# Patient Record
Sex: Female | Born: 1965 | Race: White | Hispanic: No | Marital: Married | Smoking: Never smoker
Health system: Southern US, Community
[De-identification: ages and names within clinical notes are randomized; demographics above are authoritative.]

## PROBLEM LIST (undated history)

## (undated) DIAGNOSIS — N200 Calculus of kidney: Secondary | ICD-10-CM

## (undated) DIAGNOSIS — K649 Unspecified hemorrhoids: Secondary | ICD-10-CM

## (undated) DIAGNOSIS — L309 Dermatitis, unspecified: Secondary | ICD-10-CM

## (undated) DIAGNOSIS — R42 Dizziness and giddiness: Secondary | ICD-10-CM

## (undated) DIAGNOSIS — G43909 Migraine, unspecified, not intractable, without status migrainosus: Secondary | ICD-10-CM

## (undated) HISTORY — PX: HIATAL HERNIA REPAIR: SHX195

---

## 1998-06-29 HISTORY — PX: AUGMENTATION MAMMAPLASTY: SUR837

## 2018-05-27 ENCOUNTER — Emergency Department
Admission: EM | Admit: 2018-05-27 | Discharge: 2018-05-27 | Disposition: A | Discharge status: Home / Self Care | Payer: 59 | Attending: Emergency Medicine | Admitting: Emergency Medicine

## 2018-05-27 ENCOUNTER — Emergency Department: Payer: 59

## 2018-05-27 ENCOUNTER — Other Ambulatory Visit: Payer: Self-pay

## 2018-05-27 DIAGNOSIS — R1084 Generalized abdominal pain: Secondary | ICD-10-CM | POA: Diagnosis present

## 2018-05-27 DIAGNOSIS — K529 Noninfective gastroenteritis and colitis, unspecified: Secondary | ICD-10-CM | POA: Insufficient documentation

## 2018-05-27 HISTORY — DX: Calculus of kidney: N20.0

## 2018-05-27 LAB — URINALYSIS, COMPLETE (UACMP) WITH MICROSCOPIC
Bilirubin Urine: NEGATIVE
Glucose, UA: NEGATIVE mg/dL
Hgb urine dipstick: NEGATIVE
Ketones, ur: NEGATIVE mg/dL
Leukocytes, UA: NEGATIVE
Nitrite: NEGATIVE
Protein, ur: NEGATIVE mg/dL
Specific Gravity, Urine: 1.018 (ref 1.005–1.030)
pH: 7 (ref 5.0–8.0)

## 2018-05-27 LAB — CBC
HEMATOCRIT: 46.7 % — AB (ref 36.0–46.0)
Hemoglobin: 15 g/dL (ref 12.0–15.0)
MCH: 31.2 pg (ref 26.0–34.0)
MCHC: 32.1 g/dL (ref 30.0–36.0)
MCV: 97.1 fL (ref 80.0–100.0)
Platelets: 272 10*3/uL (ref 150–400)
RBC: 4.81 MIL/uL (ref 3.87–5.11)
RDW: 12.5 % (ref 11.5–15.5)
WBC: 10.8 10*3/uL — AB (ref 4.0–10.5)
nRBC: 0 % (ref 0.0–0.2)

## 2018-05-27 LAB — COMPREHENSIVE METABOLIC PANEL
ALT: 12 U/L (ref 0–44)
AST: 27 U/L (ref 15–41)
Albumin: 5 g/dL (ref 3.5–5.0)
Alkaline Phosphatase: 48 U/L (ref 38–126)
Anion gap: 13 (ref 5–15)
BUN: 16 mg/dL (ref 6–20)
CO2: 26 mmol/L (ref 22–32)
Calcium: 9.8 mg/dL (ref 8.9–10.3)
Chloride: 101 mmol/L (ref 98–111)
Creatinine, Ser: 0.56 mg/dL (ref 0.44–1.00)
GFR calc Af Amer: >60 mL/min (ref >60)
Glucose, Bld: 120 mg/dL — ABNORMAL HIGH (ref 70–99)
Potassium: 3.2 mmol/L — ABNORMAL LOW (ref 3.5–5.1)
Sodium: 140 mmol/L (ref 135–145)
Total Bilirubin: 0.6 mg/dL (ref 0.3–1.2)
Total Protein: 8.1 g/dL (ref 6.5–8.1)

## 2018-05-27 LAB — POCT PREGNANCY, URINE: PREG TEST UR: NEGATIVE

## 2018-05-27 LAB — LIPASE, BLOOD: LIPASE: 33 U/L (ref 11–51)

## 2018-05-27 MED ORDER — ONDANSETRON 8 MG PO TBDP: 8.0000 mg | ORAL_TABLET | Freq: Three times a day (TID) | ORAL | 0 refills | Status: DC | PRN

## 2018-05-27 MED ORDER — OXYCODONE-ACETAMINOPHEN 5-325 MG PO TABS
1.0000 | ORAL_TABLET | ORAL | Status: DC | PRN
  Administered 2018-05-27: 1 via ORAL
  Filled 2018-05-27: qty 1

## 2018-05-27 MED ORDER — IOHEXOL 300 MG/ML  SOLN
75.0000 mL | Freq: Once | INTRAMUSCULAR | Status: AC | PRN
  Administered 2018-05-27: 75 mL via INTRAVENOUS
  Filled 2018-05-27: qty 75

## 2018-05-27 MED ORDER — IOPAMIDOL (ISOVUE-300) INJECTION 61%
30.0000 mL | Freq: Once | INTRAVENOUS | Status: AC | PRN
  Administered 2018-05-27: 30 mL via ORAL
  Filled 2018-05-27: qty 30

## 2018-05-27 MED ORDER — DICYCLOMINE HCL 10 MG PO CAPS
10.0000 mg | ORAL_CAPSULE | Freq: Once | ORAL | Status: AC
  Administered 2018-05-27: 10 mg via ORAL
  Filled 2018-05-27: qty 1

## 2018-05-27 MED ORDER — DICYCLOMINE HCL 10 MG PO CAPS: 10.0000 mg | ORAL_CAPSULE | Freq: Three times a day (TID) | ORAL | 0 refills | Status: DC | PRN

## 2018-05-27 NOTE — ED Notes (Addendum)
Pt to the er for diffuse abd pain that pt is unable to localize. Pt reports pain to the lower quads and moving up. Pt reports increased with movement . No BM for a week which pt states is normal for her. Denies possible food poisoning. Pt

## 2018-05-27 NOTE — Discharge Instructions (Addendum)
You can take the nausea and antispasmodic pain medication as needed if you continue to have any symptoms.  Return to the ER for new, worsening, persistent severe pain, vomiting, fever, swelling of your abdomen, or any other new or worsening symptoms that concern you.

## 2018-05-27 NOTE — ED Notes (Signed)
PT HAS COMPLETED DRINKING CONTRAST

## 2018-05-27 NOTE — ED Provider Notes (Signed)
[facility] Emergency Department Provider Note ____________________________________________   First MD Initiated Contact with Patient 05/27/18 2023     (approximate)  I have reviewed the triage vital signs and the nursing notes.   HISTORY  Chief Complaint Abdominal Pain    HPI Renee Wheeler is a 52 y.o. female with PMH as noted below (as well as reported history of a bowel obstruction which did not require surgery) who presents with abdominal pain, gradual onset over the last several hours, diffuse across most of her abdomen but feeling like pressure coming up from her lower abdomen to her upper abdomen.  She denies associated nausea, vomiting, urinary symptoms, fever, or change in her bowel movements.  She states that it feels similar to the pain she had when she had the intestinal obstruction.  The patient states that she has chronic constipation.  She has not had a bowel movement in 3 days, but states this is not unusual for her.  Past Medical History:  Diagnosis Date  . Kidney stones     There are no active problems to display for this patient.   Past Surgical History:  Procedure Laterality Date  . HIATAL HERNIA REPAIR      Prior to Admission medications   Medication Sig Start Date End Date Taking? Authorizing Provider  dicyclomine (BENTYL) 10 MG capsule Take 1 capsule (10 mg total) by mouth 3 (three) times daily as needed for up to 5 days for spasms. 05/27/18 06/01/18  Dionne Bucy, MD  ondansetron (ZOFRAN ODT) 8 MG disintegrating tablet Take 1 tablet (8 mg total) by mouth every 8 (eight) hours as needed for nausea or vomiting. 05/27/18   Dionne Bucy, MD    Allergies Penicillins  No family history on file.  Social History Social History   Tobacco Use  . Smoking status: Never Smoker  . Smokeless tobacco: Never Used  Substance Use Topics  . Alcohol use: Not on file  . Drug use: Not on file    Review of  Systems  Constitutional: No fever. Eyes: No redness. ENT: No sore throat. Cardiovascular: Denies chest pain. Respiratory: Denies shortness of breath. Gastrointestinal: No vomiting or diarrhea. Genitourinary: Negative for dysuria or hematuria.  Musculoskeletal: Negative for back pain. Skin: Negative for rash. Neurological: Negative for headache.   ____________________________________________   PHYSICAL EXAM:  VITAL SIGNS: ED Triage Vitals  Enc Vitals Group     BP 05/27/18 1933 130/90     Pulse Rate 05/27/18 1933 84     Resp --      Temp 05/27/18 1933 (!) 97.5 F (36.4 C)     Temp Source 05/27/18 1933 Oral     SpO2 05/27/18 1933 100 %     Weight 05/27/18 1933 130 lb (59 kg)     Height 05/27/18 1933 5\' 4"  (1.626 m)     Head Circumference --      Peak Flow --      Pain Score 05/27/18 1939 10     Pain Loc --      Pain Edu? --      Excl. in GC? --     Constitutional: Alert and oriented. Well appearing and in no acute distress. Eyes: Conjunctivae are normal.  No scleral icterus. Head: Atraumatic. Nose: No congestion/rhinnorhea. Mouth/Throat: Mucous membranes are moist.   Neck: Normal range of motion.  Cardiovascular:  Good peripheral circulation. Respiratory: Normal respiratory effort.  No retractions.  Gastrointestinal: Soft with moderate left lower quadrant tenderness.  No  distention.  Genitourinary: No flank tenderness. Musculoskeletal:  Extremities warm and well perfused.  Neurologic:  Normal speech and language. No gross focal neurologic deficits are appreciated.  Skin:  Skin is warm and dry. No rash noted. Psychiatric: Mood and affect are normal. Speech and behavior are normal.  ____________________________________________   LABS (all labs ordered are listed, but only abnormal results are displayed)  Labs Reviewed  COMPREHENSIVE METABOLIC PANEL - Abnormal; Notable for the following components:      Result Value   Potassium 3.2 (*)    Glucose, Bld 120 (*)     All other components within normal limits  CBC - Abnormal; Notable for the following components:   WBC 10.8 (*)    HCT 46.7 (*)    All other components within normal limits  URINALYSIS, COMPLETE (UACMP) WITH MICROSCOPIC - Abnormal; Notable for the following components:   Color, Urine YELLOW (*)    APPearance CLEAR (*)    Bacteria, UA RARE (*)    All other components within normal limits  LIPASE, BLOOD  POC URINE PREG, ED  POCT PREGNANCY, URINE   ____________________________________________  EKG   ____________________________________________  RADIOLOGY  CT abdomen: Enteritis with no other acute abnormalities  ____________________________________________   PROCEDURES  Procedure(s) performed: No  Procedures  Critical Care performed: No ____________________________________________   INITIAL IMPRESSION / ASSESSMENT AND PLAN / ED COURSE  Pertinent labs & imaging results that were available during my care of the patient were reviewed by me and considered in my medical decision making (see chart for details).  52 year old female with PMH as noted above presents with diffuse abdominal pain over the last several hours which appears to be worst in the left lower quadrant.  She has had no vomiting or change in her bowel movements.  She reports chronic constipation.  On exam she is uncomfortable but relatively well-appearing.  She has tenderness in the left lower quadrant but the rest of the abdomen is soft and nontender.  The differential is relatively broad, but I am most concerned for diverticulitis or colitis.  Differential also includes ureteral stones, pyelonephritis, or less likely SBO.  We will obtain lab work-up, CT abdomen, and reassess.  ----------------------------------------- 10:26 PM on 05/27/2018 -----------------------------------------  CT shows findings consistent with enteritis, but no other acute abnormalities.  The lab work-up is also unremarkable  except for minimally elevated WBC count.  UA is negative.  The patient continues to remain comfortable appearing with improving symptoms.  I counseled her on the results of the work-up.  She is comfortable going home.  She is stable for discharge at this time.  Return precautions given, and she expressed understanding. ____________________________________________   FINAL CLINICAL IMPRESSION(S) / ED DIAGNOSES  Final diagnoses:  Enteritis      NEW MEDICATIONS STARTED DURING THIS VISIT:  New Prescriptions   DICYCLOMINE (BENTYL) 10 MG CAPSULE    Take 1 capsule (10 mg total) by mouth 3 (three) times daily as needed for up to 5 days for spasms.   ONDANSETRON (ZOFRAN ODT) 8 MG DISINTEGRATING TABLET    Take 1 tablet (8 mg total) by mouth every 8 (eight) hours as needed for nausea or vomiting.     Note:  This document was prepared using Dragon voice recognition software and may include unintentional dictation errors.    Dionne BucySiadecki, Bronislaw Switzer, MD 05/27/18 2226

## 2018-05-27 NOTE — ED Notes (Signed)
Patient transported to CT 

## 2018-05-27 NOTE — ED Notes (Signed)
ED Provider at bedside. 

## 2018-05-27 NOTE — ED Triage Notes (Addendum)
Pt arrives to ED via POV from home with c/o abdominal pain since 3pm today. Pt reports "all over" abdominal pain, (+) nausea, but denies vomiting, diarrhea, or fever. Pt denies ever having gall bladder or appendix surgically removed. Pt is A&O, in mild distress r/t pain; RR even, regular, and unlabored. Pt reports h/x of kidney stones, but states this pain is different.

## 2018-06-10 ENCOUNTER — Encounter: Payer: Self-pay | Admitting: Obstetrics and Gynecology

## 2018-06-10 ENCOUNTER — Ambulatory Visit (INDEPENDENT_AMBULATORY_CARE_PROVIDER_SITE_OTHER): Payer: 59 | Admitting: Obstetrics and Gynecology

## 2018-06-10 VITALS — BP 124/80 | HR 75 | Ht 63.0 in | Wt 140.0 lb

## 2018-06-10 DIAGNOSIS — Z1211 Encounter for screening for malignant neoplasm of colon: Secondary | ICD-10-CM

## 2018-06-10 DIAGNOSIS — Z01419 Encounter for gynecological examination (general) (routine) without abnormal findings: Secondary | ICD-10-CM | POA: Diagnosis not present

## 2018-06-10 DIAGNOSIS — E039 Hypothyroidism, unspecified: Secondary | ICD-10-CM

## 2018-06-10 DIAGNOSIS — A609 Anogenital herpesviral infection, unspecified: Secondary | ICD-10-CM

## 2018-06-10 DIAGNOSIS — A6004 Herpesviral vulvovaginitis: Secondary | ICD-10-CM | POA: Insufficient documentation

## 2018-06-10 DIAGNOSIS — Z1239 Encounter for other screening for malignant neoplasm of breast: Secondary | ICD-10-CM

## 2018-06-10 DIAGNOSIS — N951 Menopausal and female climacteric states: Secondary | ICD-10-CM

## 2018-06-10 MED ORDER — PAROXETINE HCL 10 MG PO TABS: 10.0000 mg | ORAL_TABLET | Freq: Every day | ORAL | 11 refills | Status: DC

## 2018-06-10 NOTE — Patient Instructions (Signed)
[facility] [address] [place] [address]  [facility] [place]  [address] [place] [address]  Phone: [phone]  

## 2018-06-10 NOTE — Progress Notes (Addendum)
Gynecology Annual Exam  PCP: Patient, No Pcp Per  Chief Complaint:  Chief Complaint  Patient presents with  . Gynecologic Exam    hot flashes/mood swings/urine leakage    History of Present Illness:Patient is a 52 y.o. No obstetric history on file. presents for annual exam. The patient has no complaints today.   LMP: No LMP recorded. Patient is perimenopausal. No PMB   The patient is sexually active. She denies dyspareunia.  The patient does perform self breast exams.  There is no notable family history of breast or ovarian cancer in her family.  The patient wears seatbelts: yes.   The patient has regular exercise: not asked.    The patient denies current symptoms of depression.     Review of Systems: Review of Systems  Constitutional: Negative for chills and fever.  HENT: Negative for congestion.   Respiratory: Negative for cough and shortness of breath.   Cardiovascular: Negative for chest pain and palpitations.  Gastrointestinal: Negative for abdominal pain, constipation, diarrhea, heartburn, nausea and vomiting.  Genitourinary: Negative for dysuria, frequency and urgency.  Skin: Negative for itching and rash.  Neurological: Negative for dizziness and headaches.  Endo/Heme/Allergies: Negative for polydipsia.  Psychiatric/Behavioral: Negative for depression.    Past Medical History:  Past Medical History:  Diagnosis Date  . Kidney stones     Past Surgical History:  Past Surgical History:  Procedure Laterality Date  . HIATAL HERNIA REPAIR      Gynecologic History:  No LMP recorded. Patient is perimenopausal. Last Pap: Results were:  06/02/2017 NIL 04/17/2016 NIL 02/15/2015 NIL Last mammogram: 07/21/2016 Results were: BI-RAD II (diagnostic)   Obstetric History: No obstetric history on file.  Family History:  Family History  Problem Relation Age of Onset  . Skin cancer Mother   . Breast cancer Mother 43  . Skin cancer Father   . Stomach cancer  Maternal Grandmother     Social History:  Social History   Socioeconomic History  . Marital status: Married    Spouse name: Not on file  . Number of children: Not on file  . Years of education: Not on file  . Highest education level: Not on file  Occupational History  . Not on file  Social Needs  . Financial resource strain: Not on file  . Food insecurity:    Worry: Not on file    Inability: Not on file  . Transportation needs:    Medical: Not on file    Non-medical: Not on file  Tobacco Use  . Smoking status: Never Smoker  . Smokeless tobacco: Never Used  Substance and Sexual Activity  . Alcohol use: Yes    Comment: Occ  . Drug use: Never  . Sexual activity: Yes    Birth control/protection: None  Lifestyle  . Physical activity:    Days per week: Not on file    Minutes per session: Not on file  . Stress: Not on file  Relationships  . Social connections:    Talks on phone: Not on file    Gets together: Not on file    Attends religious service: Not on file    Active member of club or organization: Not on file    Attends meetings of clubs or organizations: Not on file    Relationship status: Not on file  . Intimate partner violence:    Fear of current or ex partner: Not on file    Emotionally abused: Not on file  Physically abused: Not on file    Forced sexual activity: Not on file  Other Topics Concern  . Not on file  Social History Narrative  . Not on file    Allergies:  Allergies  Allergen Reactions  . Penicillins Rash    Medications: Prior to Admission medications   Medication Sig Start Date End Date Taking? Authorizing Provider  dicyclomine (BENTYL) 10 MG capsule Take 1 capsule (10 mg total) by mouth 3 (three) times daily as needed for up to 5 days for spasms. 05/27/18 06/01/18  Dionne Bucy, MD  ondansetron (ZOFRAN ODT) 8 MG disintegrating tablet Take 1 tablet (8 mg total) by mouth every 8 (eight) hours as needed for nausea or vomiting.  05/27/18   Dionne Bucy, MD    Physical Exam Vitals: There were no vitals taken for this visit.  General: NAD HEENT: normocephalic, anicteric Thyroid: no enlargement, no palpable nodules Pulmonary: No increased work of breathing, CTAB Cardiovascular: RRR, distal pulses 2+ Breast: Breast symmetrical, no tenderness, no palpable nodules or masses, no skin or nipple retraction present, no nipple discharge.  No axillary or supraclavicular lymphadenopathy.  Bilateral rectopectoral implants Abdomen: NABS, soft, non-tender, non-distended.  Umbilicus without lesions.  No hepatomegaly, splenomegaly or masses palpable. No evidence of hernia  Genitourinary:  External: Normal external female genitalia.  Normal urethral meatus, normal Bartholin's and Skene's glands.    Vagina: Normal vaginal mucosa, no evidence of prolapse.    Cervix: Grossly normal in appearance, no bleeding  Uterus: Non-enlarged, mobile, normal contour.  No CMT  Adnexa: ovaries non-enlarged, no adnexal masses  Rectal: deferred  Lymphatic: no evidence of inguinal lymphadenopathy Extremities: no edema, erythema, or tenderness Neurologic: Grossly intact Psychiatric: mood appropriate, affect full  Female chaperone present for pelvic and breast  portions of the physical exam     Assessment: 52 y.o. No obstetric history on file. routine annual exam  Plan: Problem List Items Addressed This Visit      Endocrine   Hypothyroidism     Other   HSV (herpes simplex virus) anogenital infection    Other Visit Diagnoses    Encounter for gynecological examination without abnormal finding    -  Primary   Breast screening       Relevant Orders   MM 3D SCREEN BREAST BILATERAL   Colon cancer screening       Relevant Orders   Ambulatory referral to Gastroenterology   Vasomotor symptoms due to menopause          1) Mammogram - recommend yearly screening mammogram.  Mammogram Was ordered today  2) STI screening  was  notoffered and therefore not obtained  3) ASCCP guidelines and rational discussed.  Patient opts for every 3 years screening interval  4) Osteoporosis  - per USPTF routine screening DEXA at age 61   5) Routine healthcare maintenance including cholesterol, diabetes screening discussed managed by PCP  6) Colonoscopy - colonoscopy ordered  7) Vasomotor symptoms We discussed WHI study findings in detail.  In the combined estrogen-progesterone arm breast cancer risk was increased by 1.26 (CI of 1.00 to 1.59), coronary heart disease 1.29 (CI 1.02-1.63), stroke risk 1.41 (1.07-1.85), and pulmonary embolism 2.13 (CI 1.39-3.25).  That being said the while statistically significant the actual number of cases attributable are relatively small at an addition 8 cases of breast cancer, 7 more coronary artery event, 8 more strokes, and 8 additional case of pulmonary embolism per 10,000 women.  Study was terminated because of the increased breast  cancer risk, this was not seen in the progestin only arm of the study for women without an intact uterus.  In addition it is important to note that HRT also had positive or risk reducing effects, and all cause mortality between the HRT/non-HRT users is not statistically different.  Estrogen-progestin HRT decreased the relative risk of hip fracture 0.66 (CI 0.45-0.98), colorectal cancer 0.63 (0.43-0.92).  Current consensus is to limit dose to the lowest effective dose, and shortest treatment duration possible.  Breast cancer risk appeared to increase after 4 years of use.  Also important to note is that these risk refer to systemic HRT for the treatment of vasomotor symptoms, and do not apply to vaginal preperations with minimal systemic absorption and aimed at treating symptoms of vulvovaginal atrophy.    We briefly touched on findings of WHIMS trial published in 2005 which looked at women 50 year of age or older, and whether HRT was protective against the development of  dementia.  The study revealed that HRT actually increased the risk for the development of dementia but was limited by looking only at patients 50 years of age and older.  The subsequent KEEPS trial  In 2012 which looked at HRT in recently postmenopausal women did not show any improvement in cognitive function for women on HRT.  However, there was also no significant cognitive declines seen in recently postmenopausal women receiving HRT as had previously been shown in the WHIMS trial.   - opts for trial of paroxitine  7)  Return in about 1 year (around 06/11/2019) for annual.    Vena AustriaAndreas Mayo Owczarzak, MD [facility] 06/10/2018, 10:25 AM

## 2018-06-27 ENCOUNTER — Encounter: Payer: Self-pay | Admitting: *Deleted

## 2018-06-28 ENCOUNTER — Telehealth: Payer: Self-pay

## 2018-06-28 NOTE — Telephone Encounter (Signed)
Pt aware AMS is out of the office this week and will review message when he returns. Renee Wheeler

## 2018-06-28 NOTE — Telephone Encounter (Signed)
Pt states medication for hot flashes is not working.  [phone]

## 2018-07-19 ENCOUNTER — Other Ambulatory Visit: Payer: Self-pay | Admitting: Obstetrics and Gynecology

## 2018-07-19 MED ORDER — CONJ ESTROG-MEDROXYPROGEST ACE 0.3-1.5 MG PO TABS: 1.0000 | ORAL_TABLET | Freq: Every day | ORAL | 11 refills | Status: DC

## 2018-07-21 ENCOUNTER — Telehealth: Payer: Self-pay

## 2018-07-21 NOTE — Telephone Encounter (Signed)
Rx for prempro is $90-95. Can you call in something else?  [phone]

## 2018-07-22 ENCOUNTER — Other Ambulatory Visit: Payer: Self-pay | Admitting: Obstetrics and Gynecology

## 2018-07-22 ENCOUNTER — Ambulatory Visit
Admission: RE | Admit: 2018-07-22 | Discharge: 2018-07-22 | Disposition: A | Discharge status: Home / Self Care | Payer: 59 | Source: Ambulatory Visit | Attending: Obstetrics and Gynecology | Admitting: Obstetrics and Gynecology

## 2018-07-22 DIAGNOSIS — Z1239 Encounter for other screening for malignant neoplasm of breast: Secondary | ICD-10-CM

## 2018-07-22 MED ORDER — ESTROGENS CONJUGATED 0.3 MG PO TABS: 0.3000 mg | ORAL_TABLET | Freq: Every day | ORAL | 11 refills | Status: DC

## 2018-07-22 MED ORDER — PROGESTERONE MICRONIZED 100 MG PO CAPS: 100.0000 mg | ORAL_CAPSULE | Freq: Every day | ORAL | 11 refills | Status: DC

## 2018-07-22 NOTE — Telephone Encounter (Signed)
Pt aware by vm. 

## 2018-07-22 NOTE — Telephone Encounter (Signed)
Have switched to a separate estrogen and progesterone which should be cheaper

## 2018-07-25 NOTE — Telephone Encounter (Signed)
LVM for patient to call me back regarding medication

## 2018-07-25 NOTE — Telephone Encounter (Signed)
Pt calling back stating [facility] is saying the price is still the same. Can someone please follow up with pt? Thank you

## 2018-07-26 NOTE — Telephone Encounter (Signed)
Left another message for patient to call me back

## 2018-07-28 ENCOUNTER — Telehealth: Payer: Self-pay

## 2018-07-28 NOTE — Telephone Encounter (Signed)
Left detailed msg for pt to call her ins and get list of what they will cover and call us back.  Then AMS can decide what option will be best for her.

## 2018-07-28 NOTE — Telephone Encounter (Signed)
I have called this patient twice and she has not returned my calls. (See previous message) Please let her know, she will have to call her insurance company and see what they will pay for. That is 2 medications that has been too expensive. Thank you

## 2018-07-28 NOTE — Telephone Encounter (Signed)
Newest menopause med is $76 a month.  Pt is not able to do that.  Is there something else?  [phone]

## 2018-07-29 ENCOUNTER — Other Ambulatory Visit: Payer: Self-pay | Admitting: Obstetrics and Gynecology

## 2018-07-29 MED ORDER — ESTRADIOL 0.5 MG PO TABS: 0.5000 mg | ORAL_TABLET | Freq: Every day | ORAL | 11 refills | Status: DC

## 2018-07-29 MED ORDER — MEDROXYPROGESTERONE ACETATE 2.5 MG PO TABS: 10.0000 mg | ORAL_TABLET | Freq: Every day | ORAL | 11 refills | Status: DC

## 2018-07-29 NOTE — Telephone Encounter (Signed)
Please advise 

## 2018-07-29 NOTE — Telephone Encounter (Signed)
First prescription is Estradiol , and Medroxy prestrone that she said her insurance company has stated

## 2018-07-29 NOTE — Telephone Encounter (Signed)
Sent!

## 2018-08-05 ENCOUNTER — Other Ambulatory Visit: Payer: Self-pay | Admitting: Obstetrics and Gynecology

## 2018-08-05 DIAGNOSIS — R928 Other abnormal and inconclusive findings on diagnostic imaging of breast: Secondary | ICD-10-CM

## 2018-08-05 DIAGNOSIS — N6320 Unspecified lump in the left breast, unspecified quadrant: Secondary | ICD-10-CM

## 2018-08-11 ENCOUNTER — Telehealth: Payer: Self-pay | Admitting: Obstetrics and Gynecology

## 2018-08-11 NOTE — Telephone Encounter (Signed)
Patient is calling for her mammogram results. Please advise patient is very anxious.

## 2018-08-11 NOTE — Telephone Encounter (Signed)
Please advise 

## 2018-08-12 NOTE — Telephone Encounter (Signed)
No answer again I left a voicemail again, if there is a time she can be contacted this weekend I'll try to get a hold of her on call

## 2018-08-19 ENCOUNTER — Ambulatory Visit
Admission: RE | Admit: 2018-08-19 | Discharge: 2018-08-19 | Disposition: A | Discharge status: Home / Self Care | Payer: 59 | Source: Ambulatory Visit | Attending: Obstetrics and Gynecology | Admitting: Obstetrics and Gynecology

## 2018-08-19 DIAGNOSIS — R928 Other abnormal and inconclusive findings on diagnostic imaging of breast: Secondary | ICD-10-CM | POA: Diagnosis not present

## 2018-08-19 DIAGNOSIS — N6320 Unspecified lump in the left breast, unspecified quadrant: Secondary | ICD-10-CM

## 2018-08-26 ENCOUNTER — Telehealth: Payer: Self-pay

## 2018-08-26 ENCOUNTER — Other Ambulatory Visit: Payer: Self-pay | Admitting: Obstetrics and Gynecology

## 2018-08-26 MED ORDER — MEDROXYPROGESTERONE ACETATE 2.5 MG PO TABS: 1.0000 mg | ORAL_TABLET | Freq: Every day | ORAL | 11 refills | Status: DC

## 2018-08-26 NOTE — Telephone Encounter (Signed)
advise

## 2018-08-26 NOTE — Telephone Encounter (Signed)
Did she specify which medication? 

## 2018-08-26 NOTE — Telephone Encounter (Signed)
One pill of each once daily

## 2018-08-26 NOTE — Telephone Encounter (Signed)
Pt calling in ref to how to take medicine.  [phone]

## 2018-08-29 NOTE — Telephone Encounter (Signed)
LVM for patient to call back. ?

## 2018-08-29 NOTE — Telephone Encounter (Signed)
I spoke to patient and she voiced an understanding of the directions of the medication

## 2018-11-01 ENCOUNTER — Telehealth: Payer: Self-pay

## 2018-11-01 NOTE — Telephone Encounter (Signed)
I have not sent in an rx and I did not receive an rx request.  What medicine is she requesting

## 2018-11-01 NOTE — Telephone Encounter (Signed)
Patient is inquiring if AMS had sent in rx for migraine or if he can give her a refill. GT#[phone]

## 2018-11-01 NOTE — Telephone Encounter (Signed)
LMVM to notify we do not show any Migraine meds on her med list. AMS has not rx'd nor received any requests for refills from the pharmacy. Pt advised to contact us back with details on specifics of med she is requesting (if she has had something in the past)

## 2018-11-01 NOTE — Telephone Encounter (Signed)
Spoke w/pt. She states she has been taking sumatriptan (imitrex) isn't sure of the dose, but it's not working. She's vomiting with her migraines & just not able to function. Requesting something different. Also states this should be in her records she had sent here from her previous provider Dr. Lorenso Courier in [place]. Med & dose found in records under media tab & added to med list. [facility] [place].

## 2018-11-02 ENCOUNTER — Other Ambulatory Visit: Payer: Self-pay | Admitting: Obstetrics and Gynecology

## 2018-11-02 DIAGNOSIS — G43819 Other migraine, intractable, without status migrainosus: Secondary | ICD-10-CM

## 2018-11-02 MED ORDER — SUMATRIPTAN SUCCINATE 100 MG PO TABS: 100.0000 mg | ORAL_TABLET | ORAL | 6 refills | Status: DC | PRN

## 2018-11-02 NOTE — Telephone Encounter (Signed)
LMVM TRC. 

## 2018-11-02 NOTE — Telephone Encounter (Signed)
Refilled triptan, if her headache persists despite a triptan she needs a neurology evaluation which I've set up and she will get a call about

## 2018-11-02 NOTE — Telephone Encounter (Signed)
Spoke w/pt. Aware of refill & Nerology referral. Pt also mentioned that she has a GI referral for colonoscopy. She hasn't scheduled d/t not liking the attitude she received from the office she was referred to & is requesting to be scheduled elsewhere.

## 2018-11-02 NOTE — Telephone Encounter (Signed)
Referral has been rerouted to [facility].

## 2018-11-10 ENCOUNTER — Telehealth: Payer: Self-pay

## 2018-11-10 NOTE — Telephone Encounter (Signed)
Pt called triage stating the medication AMS advised her to take for the hot flashes is not working and she would also like to know what to do in order to get a colonoscopy here soon. Please advise, thank you!

## 2018-11-14 NOTE — Telephone Encounter (Signed)
Pt called wanting a call back.  [phone] 1)  Pt was told medication for hot flashes could be increased.  She doesn't know if she is do to that on her own or if new rx needs to be sent in. 2) Pt would like another referral to a different person for colonoscopy.  The last one scheduled her without her seeing them face to face.  She wants to meet c provider first. Pt aware AMS not in office today and not until Thurs.

## 2018-11-14 NOTE — Telephone Encounter (Signed)
Pt aware you are out of the office. Please advise

## 2018-11-15 NOTE — Telephone Encounter (Signed)
Patient is calling to follow up on message left. Patient aware AMS is out of the office and will return on Thursday 11/17/18

## 2018-11-15 NOTE — Telephone Encounter (Signed)
Pt aware AMS out of office til Thursday. Pt inquiring if she is to continue menopause meds the same way or double up.She is also having trouble with hemorrhoids & can't get a refill bc it's from a previous provider. [phone]

## 2018-11-15 NOTE — Telephone Encounter (Signed)
LMVM. Advised to continue same dose of meds until she hears otherwise from Dr. Bonney Aid. Regarding the rx for hemorrhoids, advised to call back with details on name of medication she is requesting & we will inquire if a provider is willing to send in rx for her.

## 2018-11-15 NOTE — Telephone Encounter (Signed)
Yes I would be willing to call it in for her if she gives [place] the name.  I've tried to reach her a couple times but only get voicemail

## 2018-11-16 NOTE — Telephone Encounter (Signed)
Pt called back, wanting to know if she can continue to take the same dose or double it? Also regarding the hemorrhoids med, she said any suppositories that will work that and are cheap. Prefers to stay aware from creams. Says shes at work from 7 am to 6 pm and cannot have her phone with her unless its lunch time.

## 2018-11-17 ENCOUNTER — Other Ambulatory Visit: Payer: Self-pay | Admitting: Obstetrics and Gynecology

## 2018-11-17 MED ORDER — ESTRADIOL 1 MG PO TABS: 1.0000 mg | ORAL_TABLET | Freq: Every day | ORAL | 6 refills | Status: DC

## 2018-11-17 MED ORDER — HYDROCORTISONE ACETATE 25 MG RE SUPP: 25.0000 mg | Freq: Two times a day (BID) | RECTAL | 1 refills | Status: DC

## 2018-11-17 NOTE — Telephone Encounter (Signed)
Patient received our message. She states pharmacy says they only received the Estradiol & not the Anusol. Also she is inquiring if her Medroxyprogesterone was to be doubled as well?[phone]

## 2018-11-17 NOTE — Telephone Encounter (Signed)
Called pt, no answer, left detailed msg

## 2018-11-17 NOTE — Telephone Encounter (Signed)
I've called in a new Rx higher dose (yes she can finish out any 0.5mg  pill by taking two for now until she pick up the new Rx).  I've also called in the suppositories

## 2018-11-17 NOTE — Telephone Encounter (Signed)
Medroxyprogesterone dose to remain at 2.5mg , annusol comes in a cream too.  All the other rx strengths are cream.  She can use OTC preparation H suppositories.

## 2018-11-17 NOTE — Telephone Encounter (Signed)
Spoke w/Pharmacy who states they did receive both Estradiol & Anusol rx's. However, they did mention that the anusol was $250, so not sure if the patient is going to want that.

## 2018-11-17 NOTE — Telephone Encounter (Signed)
Pt aware. She will check her insurance & with pharmacy to see what is covered (best/better) & call back. She will not pay $250 & states Preparation H doesn't work.

## 2018-11-18 ENCOUNTER — Other Ambulatory Visit: Payer: Self-pay | Admitting: Obstetrics and Gynecology

## 2018-11-18 MED ORDER — HYDROCORTISONE (PERIANAL) 2.5 % EX CREA: 1.0000 "application " | TOPICAL_CREAM | Freq: Two times a day (BID) | CUTANEOUS | 0 refills | Status: DC

## 2018-11-18 NOTE — Telephone Encounter (Signed)
Please advise 

## 2018-11-18 NOTE — Telephone Encounter (Signed)
Pt calling; states there is no alternatives per ins.  They suggest a cream be called in instead of suppository.  [phone]

## 2018-11-18 NOTE — Telephone Encounter (Signed)
Pt aware.

## 2018-11-18 NOTE — Telephone Encounter (Signed)
AMS pt. 

## 2018-11-18 NOTE — Telephone Encounter (Signed)
Keep medroxyprogesterone at the same dose, called in anusol in cream form which is tier3 as opposed to [employer]

## 2018-12-26 ENCOUNTER — Telehealth: Payer: Self-pay

## 2018-12-26 NOTE — Telephone Encounter (Signed)
Pt calling; it's very important we call her.  [phone]  Tried calling pt; her phone was so crackly I couldn't understand her.  Hung up; called back; left detailed msg to call and leave more detailed msg as to why we need to call her.

## 2018-12-26 NOTE — Telephone Encounter (Signed)
Pt returned call; had some pain; pain went away; now she can't control anything; has a clear thick cloudy d/c, no odor, that comes out when she least expects it.  She knows she isn't peeing on herself.  Tx'd to [facility] for scheduling.

## 2018-12-27 ENCOUNTER — Encounter: Payer: Self-pay | Admitting: Obstetrics and Gynecology

## 2018-12-27 ENCOUNTER — Other Ambulatory Visit: Payer: Self-pay

## 2018-12-27 ENCOUNTER — Ambulatory Visit: Payer: 59 | Admitting: Obstetrics and Gynecology

## 2018-12-27 VITALS — BP 106/80 | Ht 64.0 in | Wt 145.2 lb

## 2018-12-27 DIAGNOSIS — G43909 Migraine, unspecified, not intractable, without status migrainosus: Secondary | ICD-10-CM | POA: Insufficient documentation

## 2018-12-27 DIAGNOSIS — R350 Frequency of micturition: Secondary | ICD-10-CM | POA: Diagnosis not present

## 2018-12-27 DIAGNOSIS — G43819 Other migraine, intractable, without status migrainosus: Secondary | ICD-10-CM

## 2018-12-27 DIAGNOSIS — N760 Acute vaginitis: Secondary | ICD-10-CM

## 2018-12-27 DIAGNOSIS — B9689 Other specified bacterial agents as the cause of diseases classified elsewhere: Secondary | ICD-10-CM | POA: Insufficient documentation

## 2018-12-27 LAB — POCT URINALYSIS DIPSTICK
Bilirubin, UA: NEGATIVE
Blood, UA: NEGATIVE
Glucose, UA: NEGATIVE
Ketones, UA: NEGATIVE
Leukocytes, UA: NEGATIVE
Nitrite, UA: NEGATIVE
Protein, UA: NEGATIVE
Spec Grav, UA: 1.02 (ref 1.010–1.025)
pH, UA: 5 (ref 5.0–8.0)

## 2018-12-27 LAB — POCT WET PREP WITH KOH
KOH Prep POC: POSITIVE — AB
Trichomonas, UA: NEGATIVE
Yeast Wet Prep HPF POC: NEGATIVE

## 2018-12-27 MED ORDER — METRONIDAZOLE 500 MG PO TABS: 500.0000 mg | ORAL_TABLET | Freq: Two times a day (BID) | ORAL | 0 refills | Status: AC

## 2018-12-27 MED ORDER — ELETRIPTAN HYDROBROMIDE 40 MG PO TABS: 40.0000 mg | ORAL_TABLET | ORAL | 0 refills | Status: DC | PRN

## 2018-12-27 NOTE — Progress Notes (Signed)
Lindwood CokeStabler, Christine, MD   Chief Complaint  Patient presents with  . Vaginal Discharge    no odor, little itchiness no irritation x 1 week  . Urinary Tract Infection    frequency urinating, no blood/burning sensation x 1 week    HPI:      [name] is a 53 y.o. G2P1001 who LMP was No LMP recorded. Patient is perimenopausal., presents today for increased vag d/c with mild irritation, no odor, for past wk. Has gushes of clear d/c. No meds to treat, no prior abx use. Hx of BV in past. Pt is sex active with husband, no vag dryness. Takes estradiol orally occas prn vasomotor sx.  Pt with urinary frequency with and without good flow recently. No dysuria, hematuria, LBP, urinary odor. Hx of kidney stones in past.  Pt also with hx of migraines. Has done many meds without much control. Trying to see neuro but appt delayed due to covid. Dr. Bonney AidStaebler refilled imitrex but pt states it doesn't help. She did better with relpax in past but had to be changed due to ins coverage. Pt doesn't have PCP.  Last annual 12/19 with Dr. Bonney AidStaebler.   Past Medical History:  Diagnosis Date  . Kidney stones     Past Surgical History:  Procedure Laterality Date  . AUGMENTATION MAMMAPLASTY Bilateral 2000  . HIATAL HERNIA REPAIR      Family History  Problem Relation Age of Onset  . Skin cancer Mother   . Breast cancer Mother [id number]  . Skin cancer Father   . Stomach cancer Maternal Grandmother     Social History   Socioeconomic History  . Marital status: Married    Spouse name: Not on file  . Number of children: Not on file  . Years of education: Not on file  . Highest education level: Not on file  Occupational History  . Not on file  Social Needs  . Financial resource strain: Not on file  . Food insecurity    Worry: Not on file    Inability: Not on file  . Transportation needs    Medical: Not on file    Non-medical: Not on file  Tobacco Use  . Smoking status: Never Smoker  . Smokeless  tobacco: Never Used  Substance and Sexual Activity  . Alcohol use: Yes    Comment: Occ  . Drug use: Never  . Sexual activity: Yes    Birth control/protection: None  Lifestyle  . Physical activity    Days per week: Not on file    Minutes per session: Not on file  . Stress: Not on file  Relationships  . Social Musicianconnections    Talks on phone: Not on file    Gets together: Not on file    Attends religious service: Not on file    Active member of club or organization: Not on file    Attends meetings of clubs or organizations: Not on file    Relationship status: Not on file  . Intimate partner violence    Fear of current or ex partner: Not on file    Emotionally abused: Not on file    Physically abused: Not on file    Forced sexual activity: Not on file  Other Topics Concern  . Not on file  Social History Narrative  . Not on file    Outpatient Medications Prior to Visit  Medication Sig Dispense Refill  . estradiol (ESTRACE) 1 MG tablet Take 1 tablet (  1 mg total) by mouth daily. 30 tablet 6  . hydrocortisone (ANUSOL-HC) 2.5 % rectal cream Place 1 application rectally 2 (two) times daily. 30 g 0  . medroxyPROGESTERone (PROVERA) 2.5 MG tablet Take 0.5 tablets (1.25 mg total) by mouth daily. 30 tablet 11  . SUMAtriptan (IMITREX) 100 MG tablet Take 1 tablet (100 mg total) by mouth every 2 (two) hours as needed. May repeat in 2 hours if headache persists or recurs. (Patient not taking: Reported on 12/27/2018) 10 tablet 6  . ondansetron (ZOFRAN ODT) 8 MG disintegrating tablet Take 1 tablet (8 mg total) by mouth every 8 (eight) hours as needed for nausea or vomiting. (Patient not taking: Reported on 06/10/2018) 10 tablet 0  . PARoxetine (PAXIL) 10 MG tablet Take 1 tablet (10 mg total) by mouth daily. 30 tablet 11   No facility-administered medications prior to visit.       ROS:  Review of Systems  Constitutional: Negative for fatigue, fever and unexpected weight change.  Respiratory:  Negative for cough, shortness of breath and wheezing.   Cardiovascular: Negative for chest pain, palpitations and leg swelling.  Gastrointestinal: Negative for blood in stool, constipation, diarrhea, nausea and vomiting.  Endocrine: Negative for cold intolerance, heat intolerance and polyuria.  Genitourinary: Positive for frequency and vaginal discharge. Negative for dyspareunia, dysuria, flank pain, genital sores, hematuria, menstrual problem, pelvic pain, urgency, vaginal bleeding and vaginal pain.  Musculoskeletal: Negative for back pain, joint swelling and myalgias.  Skin: Negative for rash.  Neurological: Negative for dizziness, syncope, light-headedness, numbness and headaches.  Hematological: Negative for adenopathy.  Psychiatric/Behavioral: Negative for agitation, confusion, sleep disturbance and suicidal ideas. The patient is not nervous/anxious.     OBJECTIVE:   Vitals:  BP 106/80   Ht 5\' 4"  (1.626 m)   Wt 145 lb 3.2 oz (65.9 kg)   BMI 24.92 kg/m   Physical Exam Vitals signs reviewed.  Constitutional:      Appearance: She is well-developed.  Neck:     Musculoskeletal: Normal range of motion.  Pulmonary:     Effort: Pulmonary effort is normal.  Genitourinary:    General: Normal vulva.     Pubic Area: No rash.      Labia:        Right: No rash, tenderness or lesion.        Left: No rash, tenderness or lesion.      Vagina: Normal. No vaginal discharge, erythema or tenderness.     Cervix: Normal.     Uterus: Normal. Not enlarged and not tender.      Adnexa: Right adnexa normal and left adnexa normal.       Right: No mass or tenderness.         Left: No mass or tenderness.    Musculoskeletal: Normal range of motion.  Skin:    General: Skin is warm and dry.  Neurological:     General: No focal deficit present.     Mental Status: She is alert and oriented to person, place, and time.  Psychiatric:        Mood and Affect: Mood normal.        Behavior: Behavior  normal.        Thought Content: Thought content normal.        Judgment: Judgment normal.     Results: Results for orders placed or performed in visit on 12/27/18 (from the past 24 hour(s))  POCT Wet Prep with KOH     Status: Abnormal  Collection Time: 12/27/18  4:24 PM  Result Value Ref Range   Trichomonas, UA Negative    Clue Cells Wet Prep HPF POC few    Epithelial Wet Prep HPF POC     Yeast Wet Prep HPF POC neg    Bacteria Wet Prep HPF POC     RBC Wet Prep HPF POC     WBC Wet Prep HPF POC     KOH Prep POC Positive (A) Negative  POCT Urinalysis Dipstick     Status: Normal   Collection Time: 12/27/18  4:24 PM  Result Value Ref Range   Color, UA yellow    Clarity, UA clear    Glucose, UA Negative Negative   Bilirubin, UA neg    Ketones, UA neg    Spec Grav, UA 1.020 1.010 - 1.025   Blood, UA neg    pH, UA 5.0 5.0 - 8.0   Protein, UA Negative Negative   Urobilinogen, UA     Nitrite, UA neg    Leukocytes, UA Negative Negative   Appearance     Odor       Assessment/Plan: Bacterial vaginosis - Plan: POCT Wet Prep with KOH, metroNIDAZOLE (FLAGYL) 500 MG tablet, Pos sx/wet prep. Rx flagyl. No EtOH. F/u prn. Will RF if sx persist.  Urinary frequency - Plan: POCT Urinalysis Dipstick, Neg UA. F/u prn.   Other migraine without status migrainosus, intractable - Plan: eletriptan (RELPAX) 40 MG tablet, Not improved with sumatriptan. Waiting for neuro appt but delayed due to covid. Did well with relpax in past but was changed due to ins coverage. Try relpax, f/u with PCP sooner if can't see neuro yet.     Meds ordered this encounter  Medications  . metroNIDAZOLE (FLAGYL) 500 MG tablet    Sig: Take 1 tablet (500 mg total) by mouth 2 (two) times daily for 7 days.    Dispense:  14 tablet    Refill:  0    Order Specific Question:   Supervising Provider    Answer:   Gae Dry [id number]  . eletriptan (RELPAX) 40 MG tablet    Sig: Take 1 tablet (40 mg total) by mouth as  needed for migraine or headache. May repeat in 2 hours if headache persists or recurs. Max 80 mg daily    Dispense:  10 tablet    Refill:  0    Order Specific Question:   Supervising Provider    Answer:   Gae Dry [[id number]]      Return if symptoms worsen or fail to improve.  Alicia B. Copland, PA-C 12/27/2018 4:30 PM

## 2018-12-27 NOTE — Patient Instructions (Signed)
I value your feedback and entrusting us with your care. If you get a Renee Wheeler patient survey, I would appreciate you taking the time to let us know about your experience today. Thank you! 

## 2019-01-05 ENCOUNTER — Other Ambulatory Visit: Payer: Self-pay | Admitting: Maternal Newborn

## 2019-01-05 DIAGNOSIS — B9689 Other specified bacterial agents as the cause of diseases classified elsewhere: Secondary | ICD-10-CM

## 2019-01-05 MED ORDER — METRONIDAZOLE 500 MG PO TABS: 500.0000 mg | ORAL_TABLET | Freq: Two times a day (BID) | ORAL | 0 refills | Status: AC

## 2019-01-05 NOTE — Telephone Encounter (Signed)
Sent refill for Flagyl, please ask her to take as directed and let us know if her symptoms do not resolve after this.

## 2019-01-05 NOTE — Telephone Encounter (Signed)
Pt aware.

## 2019-01-05 NOTE — Telephone Encounter (Signed)
Pt returned missed calls, says discharge was clear back at visit, now is yellowish/brown.

## 2019-01-05 NOTE — Telephone Encounter (Signed)
Pt calling triage says she is still having frequency urinating and a lot of vaginal itching. We got cut off in the middle of conversation, no luck. Please advise in ABC's absence.

## 2019-02-15 ENCOUNTER — Other Ambulatory Visit: Payer: Self-pay | Admitting: Obstetrics and Gynecology

## 2019-02-15 DIAGNOSIS — G43819 Other migraine, intractable, without status migrainosus: Secondary | ICD-10-CM

## 2019-02-15 NOTE — Telephone Encounter (Signed)
Please advise 

## 2019-03-10 ENCOUNTER — Other Ambulatory Visit: Payer: Self-pay

## 2019-03-10 ENCOUNTER — Encounter: Payer: Self-pay | Admitting: Family Medicine

## 2019-03-10 ENCOUNTER — Ambulatory Visit (INDEPENDENT_AMBULATORY_CARE_PROVIDER_SITE_OTHER): Payer: Managed Care, Other (non HMO) | Admitting: Family Medicine

## 2019-03-10 VITALS — BP 102/74 | HR 62 | Temp 97.4°F | Resp 18 | Ht 62.5 in | Wt 138.4 lb

## 2019-03-10 DIAGNOSIS — Z1211 Encounter for screening for malignant neoplasm of colon: Secondary | ICD-10-CM | POA: Diagnosis not present

## 2019-03-10 DIAGNOSIS — G43819 Other migraine, intractable, without status migrainosus: Secondary | ICD-10-CM

## 2019-03-10 LAB — COMPREHENSIVE METABOLIC PANEL
ALT: 9 U/L (ref 0–35)
AST: 11 U/L (ref 0–37)
Albumin: 4.4 g/dL (ref 3.5–5.2)
Alkaline Phosphatase: 43 U/L (ref 39–117)
BUN: 17 mg/dL (ref 6–23)
CO2: 26 mEq/L (ref 19–32)
Calcium: 9.5 mg/dL (ref 8.4–10.5)
Chloride: 108 mEq/L (ref 96–112)
Creatinine, Ser: 0.55 mg/dL (ref 0.40–1.20)
GFR: 116.57 mL/min (ref >60.00)
Glucose, Bld: 82 mg/dL (ref 70–99)
Potassium: 3.9 mEq/L (ref 3.5–5.1)
Sodium: 143 mEq/L (ref 135–145)
Total Bilirubin: 0.5 mg/dL (ref 0.2–1.2)
Total Protein: 7 g/dL (ref 6.0–8.3)

## 2019-03-10 LAB — B12 AND FOLATE PANEL
Folate: 10.2 ng/mL (ref >5.9)
Vitamin B-12: 275 pg/mL (ref 211–911)

## 2019-03-10 LAB — VITAMIN D 25 HYDROXY (VIT D DEFICIENCY, FRACTURES): VITD: 23.52 ng/mL — ABNORMAL LOW (ref 30.00–100.00)

## 2019-03-10 LAB — TSH: TSH: 2.53 u[IU]/mL (ref 0.35–4.50)

## 2019-03-10 MED ORDER — BUTALBITAL-APAP-CAFFEINE 50-325-40 MG PO TABS: 1.0000 | ORAL_TABLET | Freq: Four times a day (QID) | ORAL | 0 refills | Status: DC | PRN

## 2019-03-10 MED ORDER — NORTRIPTYLINE HCL 10 MG PO CAPS: 10.0000 mg | ORAL_CAPSULE | Freq: Every day | ORAL | 2 refills | Status: DC

## 2019-03-10 NOTE — Progress Notes (Signed)
Subjective:    Patient ID: Renee Wheeler, female    DOB: 1965/12/01, 53 y.o.   MRN: [id number]  HPI   Patient presents to clinic to establish with PCP.  Main concern is getting treatment for her chronic migraines.  Patient has been using different variations of triptans for years for migraine abortive therapy, but has noticed she is getting migraines anywhere from 3-5 times a month.  States is starting to affect her working.  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  She did try Topamax as a preventative therapy, but this was not effective.  States she has had some occasions where migraines will get so bad she will have to throw up to feel better.  Also requesting referral to GI for colonoscopy  Past medical, social, surgical and family history reviewed and updated accordingly in chart.  Patient Active Problem List   Diagnosis Date Noted  . Bacterial vaginosis 12/27/2018  . Migraine 12/27/2018  . HSV (herpes simplex virus) anogenital infection 06/10/2018  . Hypothyroidism 06/10/2018   Social History   Tobacco Use  . Smoking status: Never Smoker  . Smokeless tobacco: Never Used  Substance Use Topics  . Alcohol use: Yes    Comment: Occ   Family History  Problem Relation Age of Onset  . Skin cancer Mother   . Breast cancer Mother 4250  . Skin cancer Father   . Stomach cancer Maternal Grandmother    Past Surgical History:  Procedure Laterality Date  . AUGMENTATION MAMMAPLASTY Bilateral 2000  . HIATAL HERNIA REPAIR      Review of Systems   Constitutional: Negative for chills, fatigue and fever.  HENT: Negative for congestion, ear pain, sinus pain and sore throat.   Eyes: Negative.   Respiratory: Negative for cough, shortness of breath and wheezing.   Cardiovascular: Negative for chest pain, palpitations and leg swelling.  Gastrointestinal: Negative for abdominal pain, diarrhea, nausea and vomiting.  Genitourinary:  Negative for dysuria, frequency and urgency.  Musculoskeletal: Negative for arthralgias and myalgias.  Skin: Negative for color change, pallor and rash.  Neurological: Negative for syncope, light-headedness. +migraine headaches  Psychiatric/Behavioral: The patient is not nervous/anxious.       Objective:   Physical Exam Vitals signs and nursing note reviewed.  Constitutional:      General: She is not in acute distress.    Appearance: She is not ill-appearing, toxic-appearing or diaphoretic.  HENT:     Head: Normocephalic and atraumatic.  Eyes:     General: No scleral icterus.    Extraocular Movements: Extraocular movements intact.     Conjunctiva/sclera: Conjunctivae normal.     Pupils: Pupils are equal, round, and reactive to light.  Cardiovascular:     Rate and Rhythm: Normal rate and regular rhythm.  Pulmonary:     Effort: Pulmonary effort is normal. No respiratory distress.     Breath sounds: Normal breath sounds.  Musculoskeletal:     Right lower leg: No edema.     Left lower leg: No edema.  Skin:    General: Skin is warm and dry.     Coloration: Skin is not jaundiced or pale.  Neurological:     General: No focal deficit present.     Mental Status: She is alert and oriented to person, place, and time.     Gait: Gait normal.  Psychiatric:        Mood and Affect: Mood  normal.        Behavior: Behavior normal.    Today's Vitals   03/10/19 1005  BP: 102/74  Pulse: 62  Resp: 18  Temp: (!) 97.4 F (36.3 C)  TempSrc: Temporal  SpO2: 98%  Weight: 138 lb 6.4 oz (62.8 kg)  Height: 5' 2.5" (1.588 m)   Body mass index is 24.91 kg/m.     Assessment & Plan:    Migraine headaches-discussed different options.  Patient is agreeable to start low-dose nortriptyline to see if this works for preventative therapy.  Advised she can continue to use Imitrex as needed for breakthrough therapy, but advised that long-term use of medications like Imitrex can cause him to become  less effective over time.  We will also put in referral for neurology to see if there is anything else we can be doing for better migraine management.  She will trial Fioricet as needed for breakthrough migraine treatment.  Lab work collected in clinic.  Patient referred to gastroenterology for colonoscopy due to being age 77.  We will have patient follow-up in about 4 to 6 weeks for recheck on migraines after starting nortriptyline.

## 2019-03-13 ENCOUNTER — Telehealth: Payer: Self-pay | Admitting: Family Medicine

## 2019-03-13 NOTE — Telephone Encounter (Signed)
Patient was seen on 9/11 and states she forgot to mention that she has been having painful urination and increased frequency. Patient inquired if she would need another office visit or if a medication could be sent in to alleviate symptoms. Please advise.

## 2019-03-13 NOTE — Telephone Encounter (Signed)
Called patient to schedule a virtual visit w/ Philis Nettle, FNP.  No answer.  LMTCB.

## 2019-03-14 NOTE — Telephone Encounter (Signed)
Yes please have her come in or virtual to discuss

## 2019-03-14 NOTE — Telephone Encounter (Signed)
Called Pt No answer left VM to call office to schedule appt

## 2019-03-15 ENCOUNTER — Telehealth: Payer: Self-pay | Admitting: Lab

## 2019-03-15 ENCOUNTER — Telehealth: Payer: Self-pay

## 2019-03-15 NOTE — Telephone Encounter (Signed)
Called Pt No answer left a VM to call office to schedule a Virtual visit with NP Philis Nettle. Will try back later.

## 2019-03-15 NOTE — Telephone Encounter (Signed)
Copied from [place] [id number]. Topic: General - Other >> Mar 15, 2019  2:04 PM Rainey Pines A wrote: Patient would like a callback from [facility] in regards to not being able to schedule an appointment due to work schedule and schedule a visit until next Friday but is concerned with the type of symptoms she is having. Patient stated that no one has left her any voicemail messages and has only received missed calls and would like someone to leave a message if she misses the next call.

## 2019-03-15 NOTE — Telephone Encounter (Signed)
Patient called back.  Call got disconnected from [facility].  Returned call to schedule pt a virtual appt.  No answer.  LMTCB.

## 2019-03-15 NOTE — Telephone Encounter (Signed)
Called Pt No answer left a VM to call office to schedule a Virtual visit with NP Philis Nettle.

## 2019-03-15 NOTE — Telephone Encounter (Signed)
Called Pt No answer left a VM to call office to schedule a Virtual visit with NP Lauren Guse.  

## 2019-03-16 ENCOUNTER — Telehealth: Payer: Self-pay | Admitting: Family Medicine

## 2019-03-16 ENCOUNTER — Other Ambulatory Visit: Payer: Self-pay

## 2019-03-16 NOTE — Telephone Encounter (Signed)
Called Pt back and gave the Pt her Thyroid results.

## 2019-03-16 NOTE — Telephone Encounter (Signed)
Pt would like to know what her Thyroid result is? Please advise? and Thank you!  Call pt @ [phone].

## 2019-03-16 NOTE — Telephone Encounter (Signed)
Called Pt back with results, she stated she understood with No question. Pt also has a OV appt with NP Philis Nettle on 03/20/19.

## 2019-03-20 ENCOUNTER — Ambulatory Visit (INDEPENDENT_AMBULATORY_CARE_PROVIDER_SITE_OTHER): Payer: Managed Care, Other (non HMO) | Admitting: Family Medicine

## 2019-03-20 ENCOUNTER — Encounter: Payer: Self-pay | Admitting: Family Medicine

## 2019-03-20 ENCOUNTER — Telehealth: Payer: Self-pay | Admitting: *Deleted

## 2019-03-20 ENCOUNTER — Other Ambulatory Visit: Payer: Self-pay

## 2019-03-20 VITALS — BP 122/84 | HR 70 | Temp 96.8°F | Resp 16 | Ht 62.0 in | Wt 139.4 lb

## 2019-03-20 DIAGNOSIS — R300 Dysuria: Secondary | ICD-10-CM | POA: Diagnosis not present

## 2019-03-20 DIAGNOSIS — L309 Dermatitis, unspecified: Secondary | ICD-10-CM

## 2019-03-20 DIAGNOSIS — Z8639 Personal history of other endocrine, nutritional and metabolic disease: Secondary | ICD-10-CM

## 2019-03-20 DIAGNOSIS — N760 Acute vaginitis: Secondary | ICD-10-CM

## 2019-03-20 DIAGNOSIS — B9689 Other specified bacterial agents as the cause of diseases classified elsewhere: Secondary | ICD-10-CM

## 2019-03-20 LAB — POCT URINALYSIS DIPSTICK
Bilirubin, UA: NEGATIVE
Blood, UA: NEGATIVE
Glucose, UA: NEGATIVE
Ketones, UA: NEGATIVE
Leukocytes, UA: NEGATIVE
Nitrite, UA: NEGATIVE
Protein, UA: NEGATIVE
Spec Grav, UA: 1.025 (ref 1.010–1.025)
Urobilinogen, UA: 0.2 E.U./dL
pH, UA: 6 (ref 5.0–8.0)

## 2019-03-20 MED ORDER — FLUCONAZOLE 150 MG PO TABS: 150.0000 mg | ORAL_TABLET | Freq: Once | ORAL | 0 refills | Status: AC

## 2019-03-20 MED ORDER — METRONIDAZOLE 1 % EX GEL: Freq: Every day | CUTANEOUS | 0 refills | Status: DC

## 2019-03-20 NOTE — Progress Notes (Signed)
Subjective:    Patient ID: Renee Wheeler, female    DOB: 02-22-1966, 53 y.o.   MRN: [id number]  HPI   Patient presents to clinic due to some increased urinary frequency, urgency and some burning with urination for the past 3 or 4 days.  Wondering if she may have a UTI.  Patient also does have history of bacterial vaginosis, does have some thin white vaginal discharge present as well.  No fever or chills.  No nausea vomiting or diarrhea.  Denies any outbreak of HSV sores.  Patient also reports history of reported thyroid nodule.  Has not had thyroid ultrasound in about 2 years since moving here to [place] from [place].  Also requesting referral to dermatology for eczema.  Has not seen dermatologist in little over a year since moved to  Social History   Tobacco Use  . Smoking status: Never Smoker  . Smokeless tobacco: Never Used  Substance Use Topics  . Alcohol use: Yes    Comment: Occ   Family History  Problem Relation Age of Onset  . Skin cancer Mother   . Breast cancer Mother 44  . Skin cancer Father   . Stomach cancer Maternal Grandmother    Review of Systems  Constitutional: Negative for chills, fatigue and fever.  HENT: Negative for congestion, ear pain, sinus pain and sore throat.   Eyes: Negative.   Neck: Hx of thyroid nodule. Respiratory: Negative for cough, shortness of breath and wheezing.   Cardiovascular: Negative for chest pain, palpitations and leg swelling.  Gastrointestinal: Negative for abdominal pain, diarrhea, nausea and vomiting.  Genitourinary: +dysuria, frequency and urgency.  Musculoskeletal: Negative for arthralgias and myalgias.  Skin: Negative for color change, pallor. +eczema.  Neurological: Negative for syncope, light-headedness and headaches.  Psychiatric/Behavioral: The patient is not nervous/anxious.       Objective:   Physical Exam Vitals signs and nursing note reviewed.  Constitutional:      General: She is not in acute  distress.    Appearance: She is not ill-appearing, toxic-appearing or diaphoretic.  HENT:     Head: Normocephalic and atraumatic.  Eyes:     General: No scleral icterus.    Extraocular Movements: Extraocular movements intact.     Conjunctiva/sclera: Conjunctivae normal.     Pupils: Pupils are equal, round, and reactive to light.  Neck:     Musculoskeletal: Normal range of motion and neck supple. No neck rigidity.     Thyroid: No thyroid tenderness.     Comments: No obvious nodule palpated on exam Cardiovascular:     Rate and Rhythm: Normal rate and regular rhythm.     Pulses: Normal pulses.     Heart sounds: Normal heart sounds.  Pulmonary:     Effort: Pulmonary effort is normal. No respiratory distress.     Breath sounds: Normal breath sounds.  Abdominal:     General: Bowel sounds are normal. There is no distension.     Palpations: Abdomen is soft. There is no mass.     Tenderness: There is no abdominal tenderness. There is no right CVA tenderness, left CVA tenderness, guarding or rebound.     Hernia: No hernia is present.  Musculoskeletal:     Right lower leg: No edema.     Left lower leg: No edema.  Lymphadenopathy:     Cervical: No cervical adenopathy.  Skin:    General: Skin is warm and dry.     Coloration: Skin is not jaundiced or pale.  Neurological:     Mental Status: She is alert and oriented to person, place, and time.  Psychiatric:        Mood and Affect: Mood normal.        Behavior: Behavior normal.      Today's Vitals   03/20/19 0911  BP: 122/84  Pulse: 70  Resp: 16  Temp: (!) 96.8 F (36 C)  TempSrc: Temporal  SpO2: 98%  Weight: 139 lb 6.4 oz (63.2 kg)  Height: 5\' 2"  (1.575 m)   Body mass index is 25.5 kg/m.   Assessment & Plan:   Patient's urinalysis dipstick is unremarkable for any UTI.  We will send urine culture to lab for confirmation to be sure no UTI is present.  Patient symptoms could possibly be related to bacterial vaginosis.  Will  treat with topical MetroGel.  We will also cover with Diflucan tablet x1 to treat possible yeast.  Advised that if symptoms persist, we will have to do GYN exam, declined GYN exam in office today.  Due to history of thyroid nodule reported we will get new ultrasound.  Most recent thyroid labs are normal.  Referral to dermatology placed for patient per request  1. Dysuria  - POCT Urinalysis Dipstick - Urine Culture  2. Bacterial vaginitis  - metroNIDAZOLE (METROGEL) 1 % gel; Apply topically daily.  Dispense: 45 g; Refill: 0 - fluconazole (DIFLUCAN) 150 MG tablet; Take 1 tablet (150 mg total) by mouth once for 1 dose.  Dispense: 1 tablet; Refill: 0  3. Hx of thyroid nodule  - US THYROID; Future  4. Eczema, unspecified type  - Ambulatory referral to Dermatology   Patient will keep regular follow-ups as planned.  She will return to clinic sooner if any issues arise.

## 2019-03-20 NOTE — Telephone Encounter (Signed)
Checked in at 8:38 AM for 8:20 appt.  We were able to fit her in but usually after 10 minutes we need to reschedule   With 20 minute visit slots, more than 10 minutes takes up too much of the visit and makes other patients have to wait.

## 2019-03-20 NOTE — Telephone Encounter (Signed)
Copied from [place] [id number]. Topic: Appointment Scheduling - Scheduling Inquiry for Clinic >> Mar 20, 2019  8:25 AM Oneta Rack wrote: Patient is running 10 minutes late for 8:20am appointment for today with Philis Nettle attempted to reach practice

## 2019-03-22 ENCOUNTER — Telehealth: Payer: Self-pay

## 2019-03-22 ENCOUNTER — Other Ambulatory Visit: Payer: Self-pay | Admitting: Lab

## 2019-03-22 ENCOUNTER — Telehealth: Payer: Self-pay | Admitting: Lab

## 2019-03-22 DIAGNOSIS — B9689 Other specified bacterial agents as the cause of diseases classified elsewhere: Secondary | ICD-10-CM

## 2019-03-22 DIAGNOSIS — N760 Acute vaginitis: Secondary | ICD-10-CM

## 2019-03-22 LAB — URINE CULTURE
MICRO NUMBER:: 903354
SPECIMEN QUALITY:: ADEQUATE

## 2019-03-22 NOTE — Telephone Encounter (Signed)
Pharmacy has called back stating that the other metro gel for vaginal area with applicators is the .883 Please advise as pt is wanting to pick up Renee Wheeler at [facility]

## 2019-03-22 NOTE — Telephone Encounter (Signed)
Copied from [place] [phone]. Topic: Quick Communication - Lab Results (Clinic Use ONLY) >> Mar 22, 2019  1:18 PM Scherrie Gerlach wrote: Pt would like a call back about her UA culture.  Pt states she cannot get into mychart at the moment, and would like a call back w/in 30 min, or leave her a message on her phone with results >> Mar 22, 2019  1:51 PM Sheran Luz wrote: Patient requesting call back regarding.

## 2019-03-22 NOTE — Telephone Encounter (Signed)
She should hold off on picking this up at this time and I will forward to Lauren to see about changing the prescription.

## 2019-03-22 NOTE — Telephone Encounter (Signed)
Called and left detailed message (ok per DPR) informing patient to hold picking up Rx until further notice from [place].

## 2019-03-22 NOTE — Telephone Encounter (Signed)
Patient called in to confirm whether or not she needs to be taking the metrolnidazole (Metrogel) that was recently sent to the pharmacy by Philis Nettle on 03/20/19.  Patient was seen by Ander Purpura on the same day.   Patient said that her urine sample was negative for UTI and she recently checked her lab results from the urine culture on MyChart which was also negative.  Patient said that the pharmacy told her that the Rx dx was for bacterial vaginosis but instructions said to apply to face.  Please advise if patient needs Rx.  Patient said that she needed to know today since her card has been charged for the Rx and she needs to let pharmacy know.  Informed patient that Ander Purpura has already left for the day but will send to supervising provider for review.

## 2019-03-22 NOTE — Telephone Encounter (Signed)
See phone note

## 2019-03-22 NOTE — Telephone Encounter (Signed)
[facility] sent a fax Pt was under the impression this was suppose to be a vaginal gel Metronidazole vaginal gel 0.75%

## 2019-03-23 MED ORDER — METRONIDAZOLE 0.75 % VA GEL: 1.0000 | Freq: Every day | VAGINAL | 0 refills | Status: AC

## 2019-03-23 NOTE — Addendum Note (Signed)
Addended by: Philis Nettle on: 03/23/2019 07:55 AM   Modules accepted: Orders

## 2019-03-23 NOTE — Telephone Encounter (Signed)
Rx corrected.

## 2019-03-23 NOTE — Telephone Encounter (Signed)
Called Pt No answer, left VM that Rx was changed.

## 2019-04-03 ENCOUNTER — Ambulatory Visit: Payer: 59

## 2019-04-14 ENCOUNTER — Ambulatory Visit: Payer: 59

## 2019-04-21 ENCOUNTER — Ambulatory Visit: Payer: Managed Care, Other (non HMO) | Admitting: Family Medicine

## 2019-05-08 ENCOUNTER — Encounter: Payer: Self-pay | Admitting: Family Medicine

## 2019-06-15 NOTE — Progress Notes (Deleted)
Gynecology Annual Exam  PCP: Jodelle Green, FNP  Chief Complaint: No chief complaint on file.   History of Present Illness:Patient is a 53 y.o. G2P1001 presents for annual exam. The patient has no complaints today.   LMP: No LMP recorded. Patient is perimenopausal.  The patient {sys sexually active:13135} sexually active. She {has/denies:315300} dyspareunia.  The patient {DOES_DOES ZOX:09604} perform self breast exams.  There {is/is no:19420} notable family history of breast or ovarian cancer in her family.  The patient wears seatbelts: {yes/no:63}.   The patient has regular exercise: {yes/no/not asked:9010}.    The patient {Blank single:19197::"reports","denies"} current symptoms of depression.     Review of Systems: ROS  Past Medical History:  Past Medical History:  Diagnosis Date  . Kidney stones     Past Surgical History:  Past Surgical History:  Procedure Laterality Date  . AUGMENTATION MAMMAPLASTY Bilateral 2000  . HIATAL HERNIA REPAIR      Gynecologic History:  No LMP recorded. Patient is perimenopausal. Last Pap: Results were:  06/02/2017 NIL 04/17/2016 NIL 02/15/2015 NIL Last mammogram: 08/19/2018 Results were: BI-RAD II  Obstetric History: G2P1001  Family History:  Family History  Problem Relation Age of Onset  . Skin cancer Mother   . Breast cancer Mother 47  . Skin cancer Father   . Stomach cancer Maternal Grandmother     Social History:  Social History   Socioeconomic History  . Marital status: Married    Spouse name: Not on file  . Number of children: Not on file  . Years of education: Not on file  . Highest education level: Not on file  Occupational History  . Not on file  Tobacco Use  . Smoking status: Never Smoker  . Smokeless tobacco: Never Used  Substance and Sexual Activity  . Alcohol use: Yes    Comment: Occ  . Drug use: Never  . Sexual activity: Yes    Birth control/protection: None, Post-menopausal  Other Topics  Concern  . Not on file  Social History Narrative  . Not on file   Social Determinants of Health   Financial Resource Strain:   . Difficulty of Paying Living Expenses: Not on file  Food Insecurity:   . Worried About Charity fundraiser in the Last Year: Not on file  . Ran Out of Food in the Last Year: Not on file  Transportation Needs:   . Lack of Transportation (Medical): Not on file  . Lack of Transportation (Non-Medical): Not on file  Physical Activity:   . Days of Exercise per Week: Not on file  . Minutes of Exercise per Session: Not on file  Stress:   . Feeling of Stress : Not on file  Social Connections:   . Frequency of Communication with Friends and Family: Not on file  . Frequency of Social Gatherings with Friends and Family: Not on file  . Attends Religious Services: Not on file  . Active Member of Clubs or Organizations: Not on file  . Attends Archivist Meetings: Not on file  . Marital Status: Not on file  Intimate Partner Violence:   . Fear of Current or Ex-Partner: Not on file  . Emotionally Abused: Not on file  . Physically Abused: Not on file  . Sexually Abused: Not on file    Allergies:  Allergies  Allergen Reactions  . Latex Rash  . Penicillins Rash    Medications: Prior to Admission medications   Medication Sig Start Date  End Date Taking? Authorizing Provider  butalbital-acetaminophen-caffeine (FIORICET) 50-325-40 MG tablet Take 1 tablet by mouth every 6 (six) hours as needed for headache. 03/10/19 03/09/20  Guse, Janna Arch, FNP  metroNIDAZOLE (METROGEL) 1 % gel Apply topically daily. 03/20/19   Tracey Harries, FNP  nortriptyline (PAMELOR) 10 MG capsule Take 1 capsule (10 mg total) by mouth at bedtime. 03/10/19   Tracey Harries, FNP    Physical Exam Vitals: ***  General: NAD HEENT: normocephalic, anicteric Thyroid: no enlargement, no palpable nodules Pulmonary: No increased work of breathing, CTAB Cardiovascular: RRR, distal pulses  2+ Breast: Breast symmetrical, no tenderness, no palpable nodules or masses, no skin or nipple retraction present, no nipple discharge.  No axillary or supraclavicular lymphadenopathy. Abdomen: NABS, soft, non-tender, non-distended.  Umbilicus without lesions.  No hepatomegaly, splenomegaly or masses palpable. No evidence of hernia  Genitourinary:  External: Normal external female genitalia.  Normal urethral meatus, normal Bartholin's and Skene's glands.    Vagina: Normal vaginal mucosa, no evidence of prolapse.    Cervix: Grossly normal in appearance, no bleeding  Uterus: Non-enlarged, mobile, normal contour.  No CMT  Adnexa: ovaries non-enlarged, no adnexal masses  Rectal: deferred  Lymphatic: no evidence of inguinal lymphadenopathy Extremities: no edema, erythema, or tenderness Neurologic: Grossly intact Psychiatric: mood appropriate, affect full  Female chaperone present for pelvic and breast  portions of the physical exam     Assessment: 53 y.o. G2P1001 routine annual exam  Plan: Problem List Items Addressed This Visit    None    Visit Diagnoses    Encounter for gynecological examination without abnormal finding    -  Primary   Breast screening          1) Mammogram - recommend yearly screening mammogram.  Mammogram Was ordered today  2) STI screening  was notoffered and therefore not obtained  3) ASCCP guidelines and rational discussed.  Patient opts for every 3 years screening interval  4) Osteoporosis  - per USPTF routine screening DEXA at age 76  5) Routine healthcare maintenance including cholesterol, diabetes screening discussed managed by PCP  6) Colonoscopy ***  7) No follow-ups on file.    Vena Austria, MD Domingo Pulse, [facility] 06/15/2019, 2:05 PM

## 2019-06-16 ENCOUNTER — Ambulatory Visit: Payer: 59 | Admitting: Obstetrics and Gynecology

## 2019-07-11 ENCOUNTER — Other Ambulatory Visit: Payer: Self-pay | Admitting: Obstetrics and Gynecology

## 2019-07-11 DIAGNOSIS — Z1231 Encounter for screening mammogram for malignant neoplasm of breast: Secondary | ICD-10-CM

## 2019-07-21 ENCOUNTER — Encounter: Payer: Self-pay | Admitting: Obstetrics and Gynecology

## 2019-07-21 ENCOUNTER — Ambulatory Visit (INDEPENDENT_AMBULATORY_CARE_PROVIDER_SITE_OTHER): Payer: Managed Care, Other (non HMO) | Admitting: Obstetrics and Gynecology

## 2019-07-21 ENCOUNTER — Other Ambulatory Visit: Payer: Self-pay

## 2019-07-21 VITALS — BP 112/70 | HR 69 | Ht 64.0 in | Wt 143.0 lb

## 2019-07-21 DIAGNOSIS — Z01419 Encounter for gynecological examination (general) (routine) without abnormal findings: Secondary | ICD-10-CM

## 2019-07-21 DIAGNOSIS — G43709 Chronic migraine without aura, not intractable, without status migrainosus: Secondary | ICD-10-CM

## 2019-07-21 DIAGNOSIS — Z13220 Encounter for screening for lipoid disorders: Secondary | ICD-10-CM

## 2019-07-21 DIAGNOSIS — Z131 Encounter for screening for diabetes mellitus: Secondary | ICD-10-CM

## 2019-07-21 DIAGNOSIS — Z1329 Encounter for screening for other suspected endocrine disorder: Secondary | ICD-10-CM

## 2019-07-21 DIAGNOSIS — F411 Generalized anxiety disorder: Secondary | ICD-10-CM

## 2019-07-21 DIAGNOSIS — Z1239 Encounter for other screening for malignant neoplasm of breast: Secondary | ICD-10-CM

## 2019-07-21 DIAGNOSIS — Z1211 Encounter for screening for malignant neoplasm of colon: Secondary | ICD-10-CM

## 2019-07-21 MED ORDER — HYDROXYZINE HCL 25 MG PO TABS: 25.0000 mg | ORAL_TABLET | Freq: Four times a day (QID) | ORAL | 2 refills | Status: DC | PRN

## 2019-07-21 MED ORDER — PROPRANOLOL HCL ER 80 MG PO CP24: 80.0000 mg | ORAL_CAPSULE | Freq: Every day | ORAL | 6 refills | Status: DC

## 2019-07-21 MED ORDER — SUMATRIPTAN SUCCINATE 100 MG PO TABS: 100.0000 mg | ORAL_TABLET | ORAL | 6 refills | Status: DC | PRN

## 2019-07-21 MED ORDER — ESCITALOPRAM OXALATE 10 MG PO TABS: 10.0000 mg | ORAL_TABLET | Freq: Every day | ORAL | 2 refills | Status: DC

## 2019-07-21 NOTE — Progress Notes (Signed)
Gynecology Annual Exam  PCP: Tracey Harries, FNP  Chief Complaint:  Chief Complaint  Patient presents with  . Gynecologic Exam    Fatigued, weight gain, recheck Thyroid today    History of Present Illness:Patient is a 54 y.o. G2P1001 presents for annual exam. The patient has no complaints today.   LMP: No LMP recorded. Patient is perimenopausal. No PMB bleeding.  Still having vasomotor symptoms  The patient is sexually active. She denies dyspareunia.  The patient does perform self breast exams.  There is no notable family history of breast or ovarian cancer in her family.  The patient wears seatbelts: yes.   The patient has regular exercise: not asked.    The patient reports current symptoms of depression.     Review of Systems: Review of Systems  Constitutional: Negative for chills and fever.  HENT: Negative for congestion.   Respiratory: Negative for cough and shortness of breath.   Cardiovascular: Negative for chest pain and palpitations.  Gastrointestinal: Negative for abdominal pain, constipation, diarrhea, heartburn, nausea and vomiting.  Genitourinary: Negative for dysuria, frequency and urgency.  Skin: Negative for itching and rash.  Neurological: Negative for dizziness and headaches.  Endo/Heme/Allergies: Negative for polydipsia.  Psychiatric/Behavioral: Negative for depression. The patient is nervous/anxious.     Past Medical History:  Past Medical History:  Diagnosis Date  . Kidney stones     Past Surgical History:  Past Surgical History:  Procedure Laterality Date  . AUGMENTATION MAMMAPLASTY Bilateral 2000  . HIATAL HERNIA REPAIR      Gynecologic History:  No LMP recorded. Patient is perimenopausal. Last Pap: Results were:  06/02/2017 NIL 04/17/2016 NIL 02/15/2015 NIL Last mammogram:08/19/2018 Results were: BI-RAD II  Obstetric History: G2P1001  Family History:  Family History  Problem Relation Age of Onset  . Skin cancer Mother   .  Breast cancer Mother 15  . Skin cancer Father   . Stomach cancer Maternal Grandmother     Social History:  Social History   Socioeconomic History  . Marital status: Married    Spouse name: Not on file  . Number of children: Not on file  . Years of education: Not on file  . Highest education level: Not on file  Occupational History  . Not on file  Tobacco Use  . Smoking status: Never Smoker  . Smokeless tobacco: Never Used  Substance and Sexual Activity  . Alcohol use: Yes    Comment: Occ  . Drug use: Never  . Sexual activity: Yes    Birth control/protection: None, Post-menopausal  Other Topics Concern  . Not on file  Social History Narrative  . Not on file   Social Determinants of Health   Financial Resource Strain:   . Difficulty of Paying Living Expenses: Not on file  Food Insecurity:   . Worried About Programme researcher, broadcasting/film/video in the Last Year: Not on file  . Ran Out of Food in the Last Year: Not on file  Transportation Needs:   . Lack of Transportation (Medical): Not on file  . Lack of Transportation (Non-Medical): Not on file  Physical Activity:   . Days of Exercise per Week: Not on file  . Minutes of Exercise per Session: Not on file  Stress:   . Feeling of Stress : Not on file  Social Connections:   . Frequency of Communication with Friends and Family: Not on file  . Frequency of Social Gatherings with Friends and Family: Not on file  .  Attends Religious Services: Not on file  . Active Member of Clubs or Organizations: Not on file  . Attends Banker Meetings: Not on file  . Marital Status: Not on file  Intimate Partner Violence:   . Fear of Current or Ex-Partner: Not on file  . Emotionally Abused: Not on file  . Physically Abused: Not on file  . Sexually Abused: Not on file    Allergies:  Allergies  Allergen Reactions  . Latex Rash  . Penicillins Rash    Medications: Prior to Admission medications   Medication Sig Start Date End Date  Taking? Authorizing Provider  doxycycline (VIBRAMYCIN) 100 MG capsule Take 100 mg by mouth daily. 05/13/19  Yes [provider]  hydrocortisone 2.5 % cream APP TO FACE BID FOR 5 TO 7 DAYS 04/07/19  Yes [provider]  RHOFADE 1 % CREA  05/23/19  Yes [provider]  SUMAtriptan (IMITREX) 100 MG tablet Take 100 mg by mouth every 2 (two) hours as needed for migraine. May repeat in 2 hours if headache persists or recurs.   Yes [provider]    Physical Exam Vitals: Blood pressure 112/70, pulse 69, height 5\' 4"  (1.626 m), weight 143 lb (64.9 kg).  General: NAD HEENT: normocephalic, anicteric Thyroid: no enlargement, no palpable nodules Pulmonary: No increased work of breathing, CTAB Cardiovascular: RRR, distal pulses 2+ Breast: Breast symmetrical, no tenderness, no palpable nodules or masses, no skin or nipple retraction present, no nipple discharge.  No axillary or supraclavicular lymphadenopathy. Abdomen: NABS, soft, non-tender, non-distended.  Umbilicus without lesions.  No hepatomegaly, splenomegaly or masses palpable. No evidence of hernia  Genitourinary:  External: Normal external female genitalia.  Normal urethral meatus, normal Bartholin's and Skene's glands.    Vagina: Normal vaginal mucosa, no evidence of prolapse.    Cervix: Grossly normal in appearance, no bleeding  Uterus: Non-enlarged, mobile, normal contour.  No CMT  Adnexa: ovaries non-enlarged, no adnexal masses  Rectal: deferred  Lymphatic: no evidence of inguinal lymphadenopathy Extremities: no edema, erythema, or tenderness Neurologic: Grossly intact Psychiatric: mood appropriate, affect full  Female chaperone present for pelvic and breast  portions of the physical exam     Assessment: 54 y.o. G2P1001 routine annual exam  Plan: Problem List Items Addressed This Visit      Cardiovascular and Mediastinum   Migraine   Relevant Medications   propranolol ER (INDERAL LA) 80 MG  24 hr capsule   SUMAtriptan (IMITREX) 100 MG tablet   escitalopram (LEXAPRO) 10 MG tablet   Other Relevant Orders   Ambulatory referral to Neurology    Other Visit Diagnoses    Encounter for gynecological examination without abnormal finding    -  Primary   Relevant Orders   CMP14+LP+TP+TSH+CBC/Plt   Breast screening       Relevant Orders   MM DIAG BREAST W/IMPLANT TOMO BILATERAL   Thyroid disorder screening       Relevant Orders   CMP14+LP+TP+TSH+CBC/Plt   Screening for diabetes mellitus       Relevant Orders   CMP14+LP+TP+TSH+CBC/Plt   Lipid screening       Relevant Orders   CMP14+LP+TP+TSH+CBC/Plt   Generalized anxiety disorder       Relevant Medications   escitalopram (LEXAPRO) 10 MG tablet   hydrOXYzine (ATARAX/VISTARIL) 25 MG tablet   Colon cancer screening       Relevant Orders   Ambulatory referral to Gastroenterology      1) Mammogram - recommend yearly screening mammogram.  Mammogram Was ordered today  2) STI screening  was notoffered and therefore not obtained  3) ASCCP guidelines and rational discussed.  Patient opts for every 3 years screening interval  4) Osteoporosis  - per USPTF routine screening DEXA at age 16  5) Routine healthcare maintenance including cholesterol, diabetes screening discussed Ordered today  6) Colonoscopy at age 18  7) Migraines - refill imitrex, start propranolol trial.  Neurology referral.  8) Generalized anxiety - was on lexapro previously, restart.  Rx vistaril prn.  Son diagnosed with testicular cancer around christmas  9) Return in about 1 year (around 07/20/2020) for annual.    Malachy Mood, MD Mosetta Pigeon, [facility] 07/21/2019, 10:27 AM

## 2019-07-21 NOTE — Patient Instructions (Signed)
[facility] [address] [place] [address]  [facility]  [address] [place] [address]  Phone: [phone]  

## 2019-07-22 LAB — CMP14+LP+TP+TSH+CBC/PLT
ALT: 12 IU/L (ref 0–32)
AST: 21 IU/L (ref 0–40)
Albumin/Globulin Ratio: 1.7 (ref 1.2–2.2)
Albumin: 4.8 g/dL (ref 3.8–4.9)
Alkaline Phosphatase: 59 IU/L (ref 39–117)
BUN/Creatinine Ratio: 20 (ref 9–23)
BUN: 13 mg/dL (ref 6–24)
Bilirubin Total: 0.4 mg/dL (ref 0.0–1.2)
CO2: 26 mmol/L (ref 20–29)
Calcium: 9.9 mg/dL (ref 8.7–10.2)
Chloride: 103 mmol/L (ref 96–106)
Cholesterol, Total: 220 mg/dL — ABNORMAL HIGH (ref 100–199)
Creatinine, Ser: 0.64 mg/dL (ref 0.57–1.00)
Free Thyroxine Index: 1.7 (ref 1.2–4.9)
GFR calc Af Amer: 119 mL/min/{1.73_m2} (ref >59)
GFR calc non Af Amer: 104 mL/min/{1.73_m2} (ref >59)
Globulin, Total: 2.8 g/dL (ref 1.5–4.5)
Glucose: 80 mg/dL (ref 65–99)
HDL: 64 mg/dL (ref >39)
Hematocrit: 42.9 % (ref 34.0–46.6)
Hemoglobin: 14.4 g/dL (ref 11.1–15.9)
LDL Chol Calc (NIH): 138 mg/dL — ABNORMAL HIGH (ref 0–99)
LDL/HDL Ratio: 2.2 ratio (ref 0.0–3.2)
MCH: 31.4 pg (ref 26.6–33.0)
MCHC: 33.6 g/dL (ref 31.5–35.7)
MCV: 94 fL (ref 79–97)
Platelets: 230 10*3/uL (ref 150–450)
Potassium: 4.3 mmol/L (ref 3.5–5.2)
RBC: 4.59 x10E6/uL (ref 3.77–5.28)
RDW: 11.7 % (ref 11.7–15.4)
Sodium: 144 mmol/L (ref 134–144)
T3 Uptake Ratio: 22 % — ABNORMAL LOW (ref 24–39)
T4, Total: 7.8 ug/dL (ref 4.5–12.0)
TSH: 4.71 u[IU]/mL — ABNORMAL HIGH (ref 0.450–4.500)
Total Protein: 7.6 g/dL (ref 6.0–8.5)
Triglycerides: 104 mg/dL (ref 0–149)
VLDL Cholesterol Cal: 18 mg/dL (ref 5–40)
WBC: 4.3 10*3/uL (ref 3.4–10.8)

## 2019-07-27 ENCOUNTER — Other Ambulatory Visit: Payer: Self-pay

## 2019-07-27 ENCOUNTER — Telehealth: Payer: Self-pay | Admitting: Obstetrics and Gynecology

## 2019-07-27 ENCOUNTER — Ambulatory Visit
Admission: EM | Admit: 2019-07-27 | Discharge: 2019-07-27 | Disposition: A | Discharge status: Home / Self Care | Payer: Managed Care, Other (non HMO) | Attending: Emergency Medicine | Admitting: Emergency Medicine

## 2019-07-27 ENCOUNTER — Encounter: Payer: Self-pay | Admitting: Emergency Medicine

## 2019-07-27 DIAGNOSIS — R110 Nausea: Secondary | ICD-10-CM | POA: Diagnosis not present

## 2019-07-27 DIAGNOSIS — R519 Headache, unspecified: Secondary | ICD-10-CM

## 2019-07-27 DIAGNOSIS — R42 Dizziness and giddiness: Secondary | ICD-10-CM

## 2019-07-27 DIAGNOSIS — G8929 Other chronic pain: Secondary | ICD-10-CM

## 2019-07-27 MED ORDER — KETOROLAC TROMETHAMINE 60 MG/2ML IM SOLN
60.0000 mg | Freq: Once | INTRAMUSCULAR | Status: AC
  Administered 2019-07-27: 60 mg via INTRAMUSCULAR

## 2019-07-27 MED ORDER — ONDANSETRON HCL 4 MG PO TABS: 4.0000 mg | ORAL_TABLET | Freq: Four times a day (QID) | ORAL | 0 refills | Status: DC

## 2019-07-27 MED ORDER — ONDANSETRON 4 MG PO TBDP
4.0000 mg | ORAL_TABLET | Freq: Once | ORAL | Status: AC
  Administered 2019-07-27: 15:00:00 4 mg via ORAL

## 2019-07-27 MED ORDER — MECLIZINE HCL 12.5 MG PO TABS: 12.5000 mg | ORAL_TABLET | Freq: Three times a day (TID) | ORAL | 0 refills | Status: DC | PRN

## 2019-07-27 NOTE — ED Provider Notes (Signed)
Renaldo Fiddler    CSN: [id number] Arrival date & time: 07/27/19  1424      History   Chief Complaint Chief Complaint  Patient presents with  . Nausea  . Migraine    HPI Renee Wheeler is a 54 y.o. female.   Patient presents with dizziness, nausea, and a headache since this morning.  Patient reports she has a history of chronic headaches which she describes as "migraines"; she states she took sumatriptan hand today without relief.  She reports having dizziness prior to today but "not this bad"; the dizziness is worse with turning her head and movement; it improves with rest.  No emesis.  She denies fever, chills, focal weakness, rash, earache, sore throat, cough, shortness of breath, diarrhea, or other symptoms.     The history is provided by the patient.    Past Medical History:  Diagnosis Date  . Kidney stones     Patient Active Problem List   Diagnosis Date Noted  . Bacterial vaginosis 12/27/2018  . Migraine 12/27/2018  . HSV (herpes simplex virus) anogenital infection 06/10/2018  . Hypothyroidism 06/10/2018    Past Surgical History:  Procedure Laterality Date  . AUGMENTATION MAMMAPLASTY Bilateral 2000  . HIATAL HERNIA REPAIR      OB History    Gravida  2   Para  2   Term  1   Preterm      AB      Living  1     SAB      TAB      Ectopic      Multiple      Live Births  1            Home Medications    Prior to Admission medications   Medication Sig Start Date End Date Taking? Authorizing Provider  SUMAtriptan (IMITREX) 100 MG tablet Take 1 tablet (100 mg total) by mouth every 2 (two) hours as needed for migraine. May repeat in 2 hours if headache persists or recurs. 07/21/19  Yes Vena Austria, MD  doxycycline (VIBRAMYCIN) 100 MG capsule Take 100 mg by mouth daily. 05/13/19   [provider]  escitalopram (LEXAPRO) 10 MG tablet Take 1 tablet (10 mg total) by mouth daily. 07/21/19 07/20/20  Vena Austria, MD    hydrocortisone 2.5 % cream APP TO FACE BID FOR 5 TO 7 DAYS 04/07/19   [provider]  hydrOXYzine (ATARAX/VISTARIL) 25 MG tablet Take 1 tablet (25 mg total) by mouth every 6 (six) hours as needed for anxiety. 07/21/19   Vena Austria, MD  meclizine (ANTIVERT) 12.5 MG tablet Take 1 tablet (12.5 mg total) by mouth 3 (three) times daily as needed for dizziness. 07/27/19   Mickie Bail, NP  ondansetron (ZOFRAN) 4 MG tablet Take 1 tablet (4 mg total) by mouth every 6 (six) hours. 07/27/19   Mickie Bail, NP  propranolol ER (INDERAL LA) 80 MG 24 hr capsule Take 1 capsule (80 mg total) by mouth daily. 07/21/19   Vena Austria, MD  RHOFADE 1 % CREA  05/23/19   [provider]    Family History Family History  Problem Relation Age of Onset  . Skin cancer Mother   . Breast cancer Mother 10  . Skin cancer Father   . Stomach cancer Maternal Grandmother     Social History Social History   Tobacco Use  . Smoking status: Never Smoker  . Smokeless tobacco: Never Used  Substance Use Topics  .  Alcohol use: Yes    Comment: Occ  . Drug use: Never     Allergies   Latex and Penicillins   Review of Systems Review of Systems  Constitutional: Negative for chills and fever.  HENT: Negative for ear pain and sore throat.   Eyes: Negative for pain and visual disturbance.  Respiratory: Negative for cough and shortness of breath.   Cardiovascular: Negative for chest pain and palpitations.  Gastrointestinal: Positive for nausea. Negative for abdominal pain, diarrhea and vomiting.  Genitourinary: Negative for dysuria and hematuria.  Musculoskeletal: Negative for arthralgias and back pain.  Skin: Negative for color change and rash.  Neurological: Positive for dizziness and headaches. Negative for seizures, syncope, facial asymmetry, speech difficulty, weakness and numbness.  All other systems reviewed and are negative.    Physical Exam Triage Vital Signs ED Triage Vitals   Enc Vitals Group     BP 07/27/19 1428 132/86     Pulse Rate 07/27/19 1428 70     Resp 07/27/19 1428 18     Temp 07/27/19 1428 97.7 F (36.5 C)     Temp Source 07/27/19 1428 Oral     SpO2 07/27/19 1428 98 %     Weight 07/27/19 1429 143 lb (64.9 kg)     Height 07/27/19 1429 5\' 4"  (1.626 m)     Head Circumference --      Peak Flow --      Pain Score 07/27/19 1428 10     Pain Loc --      Pain Edu? --      Excl. in [place]? --    No data found.  Updated Vital Signs BP 132/86 (BP Location: Left Arm)   Pulse 70   Temp 97.7 F (36.5 C) (Oral)   Resp 18   Ht 5\' 4"  (1.626 m)   Wt 143 lb (64.9 kg)   SpO2 98%   BMI 24.55 kg/m   Visual Acuity Right Eye Distance:   Left Eye Distance:   Bilateral Distance:    Right Eye Near:   Left Eye Near:    Bilateral Near:     Physical Exam Vitals and nursing note reviewed.  Constitutional:      General: She is not in acute distress.    Appearance: She is well-developed. She is not ill-appearing.  HENT:     Head: Normocephalic and atraumatic.     Right Ear: Tympanic membrane normal.     Left Ear: Tympanic membrane normal.     Nose: Nose normal.     Mouth/Throat:     Mouth: Mucous membranes are moist.     Pharynx: Oropharynx is clear.  Eyes:     Conjunctiva/sclera: Conjunctivae normal.  Cardiovascular:     Rate and Rhythm: Normal rate and regular rhythm.     Heart sounds: Normal heart sounds. No murmur.  Pulmonary:     Effort: Pulmonary effort is normal. No respiratory distress.     Breath sounds: Normal breath sounds. No wheezing or rhonchi.  Abdominal:     General: Bowel sounds are normal.     Palpations: Abdomen is soft.     Tenderness: There is no abdominal tenderness. There is no guarding or rebound.  Musculoskeletal:     Cervical back: Neck supple.     Right lower leg: No edema.     Left lower leg: No edema.  Skin:    General: Skin is warm and dry.     Findings: No rash.  Neurological:  General: No focal deficit  present.     Mental Status: She is alert and oriented to person, place, and time.     Sensory: No sensory deficit.     Motor: No weakness.     Coordination: Romberg sign negative. Coordination normal.     Gait: Gait normal.     Deep Tendon Reflexes: Reflexes normal.  Psychiatric:        Mood and Affect: Mood normal.        Behavior: Behavior normal.      UC Treatments / Results  Labs (all labs ordered are listed, but only abnormal results are displayed) Labs Reviewed - No data to display  EKG   Radiology No results found.  Procedures Procedures (including critical care time)  Medications Ordered in UC Medications  ondansetron (ZOFRAN-ODT) disintegrating tablet 4 mg (4 mg Oral Given 07/27/19 1459)  ketorolac (TORADOL) injection 60 mg (60 mg Intramuscular Given 07/27/19 1459)    Initial Impression / Assessment and Plan / UC Course  I have reviewed the triage vital signs and the nursing notes.  Pertinent labs & imaging results that were available during my care of the patient were reviewed by me and considered in my medical decision making (see chart for details).   Dizziness, nausea, headache.  Treated here with Toradol and Zofran.  Treating at home with meclizine and Zofran.  Precautions for drowsiness with meclizine discussed with patient.  Instructed patient to follow-up with her PCP in 1 to 2 days.  Instructed her to go to the emergency department if she has acute worsening symptoms, including worsening headache or dizziness.  Patient agrees to plan of care.      Final Clinical Impressions(s) / UC Diagnoses   Final diagnoses:  Dizziness and giddiness  Nausea without vomiting  Chronic nonintractable headache, unspecified headache type     Discharge Instructions     You were given an injection of Toradol and a tablet of Zofran here today.    Take the prescribed Zofran as needed for nausea.  Take the prescribed meclizine as needed for dizziness; do not drive,  operate machinery, or drink alcohol with this medication as it will cause drowsiness.    Follow-up with your primary care provider in 1 to 2 days.  Go to the emergency department if you have acute worsening symptoms, including worsening headache or dizziness.        ED Prescriptions    Medication Sig Dispense Auth. Provider   ondansetron (ZOFRAN) 4 MG tablet Take 1 tablet (4 mg total) by mouth every 6 (six) hours. 12 tablet Mickie Bail, NP   meclizine (ANTIVERT) 12.5 MG tablet Take 1 tablet (12.5 mg total) by mouth 3 (three) times daily as needed for dizziness. 30 tablet Mickie Bail, NP     I have reviewed the PDMP during this encounter.   Mickie Bail, NP 07/27/19 1505

## 2019-07-27 NOTE — Telephone Encounter (Signed)
Patient following up on neurology referral.  Having symptoms of dizziness with new med, would like to get in sooner for referral.

## 2019-07-27 NOTE — ED Triage Notes (Signed)
Pt c/o dizziness, nausea and headache. Started this morning. She states that she gets migraines and she normally has symptoms like these but the dizziness is not as severe as this. She states this dizziness is worse when she turns her head. She took her sumatriptan for her migraines without relief.

## 2019-07-27 NOTE — Discharge Instructions (Addendum)
You were given an injection of Toradol and a tablet of Zofran here today.    Take the prescribed Zofran as needed for nausea.  Take the prescribed meclizine as needed for dizziness; do not drive, operate machinery, or drink alcohol with this medication as it will cause drowsiness.    Follow-up with your primary care provider in 1 to 2 days.  Go to the emergency department if you have acute worsening symptoms, including worsening headache or dizziness.

## 2019-07-28 NOTE — Telephone Encounter (Signed)
Voicemail left for patient with information

## 2019-08-01 ENCOUNTER — Encounter: Payer: Self-pay | Admitting: *Deleted

## 2019-08-04 ENCOUNTER — Ambulatory Visit
Admission: RE | Admit: 2019-08-04 | Discharge: 2019-08-04 | Disposition: A | Discharge status: Home / Self Care | Payer: Managed Care, Other (non HMO) | Source: Ambulatory Visit | Attending: Obstetrics and Gynecology | Admitting: Obstetrics and Gynecology

## 2019-08-04 DIAGNOSIS — Z1231 Encounter for screening mammogram for malignant neoplasm of breast: Secondary | ICD-10-CM | POA: Insufficient documentation

## 2019-08-21 ENCOUNTER — Telehealth: Payer: Self-pay | Admitting: Obstetrics and Gynecology

## 2019-08-21 NOTE — Telephone Encounter (Signed)
Patient is calling for labs results. Please advise. 

## 2019-08-22 NOTE — Telephone Encounter (Signed)
Labs were normal I sent a mychart message as well and left a voicemail if she wants any more details

## 2019-09-06 ENCOUNTER — Telehealth: Payer: Self-pay

## 2019-09-06 ENCOUNTER — Ambulatory Visit: Payer: 59 | Admitting: Obstetrics and Gynecology

## 2019-09-06 NOTE — Progress Notes (Deleted)
Tracey Harries, FNP   No chief complaint on file.   HPI:      [name] is a 53 y.o. G2P1001 who LMP was No LMP recorded. Patient is perimenopausal., presents today for ***    Past Medical History:  Diagnosis Date  . Kidney stones     Past Surgical History:  Procedure Laterality Date  . AUGMENTATION MAMMAPLASTY Bilateral 2000  . HIATAL HERNIA REPAIR      Family History  Problem Relation Age of Onset  . Skin cancer Mother   . Breast cancer Mother 68  . Skin cancer Father   . Stomach cancer Maternal Grandmother     Social History   Socioeconomic History  . Marital status: Married    Spouse name: Not on file  . Number of children: Not on file  . Years of education: Not on file  . Highest education level: Not on file  Occupational History  . Not on file  Tobacco Use  . Smoking status: Never Smoker  . Smokeless tobacco: Never Used  Substance and Sexual Activity  . Alcohol use: Yes    Comment: Occ  . Drug use: Never  . Sexual activity: Yes    Birth control/protection: None, Post-menopausal  Other Topics Concern  . Not on file  Social History Narrative  . Not on file   Social Determinants of Health   Financial Resource Strain:   . Difficulty of Paying Living Expenses: Not on file  Food Insecurity:   . Worried About Programme researcher, broadcasting/film/video in the Last Year: Not on file  . Ran Out of Food in the Last Year: Not on file  Transportation Needs:   . Lack of Transportation (Medical): Not on file  . Lack of Transportation (Non-Medical): Not on file  Physical Activity:   . Days of Exercise per Week: Not on file  . Minutes of Exercise per Session: Not on file  Stress:   . Feeling of Stress : Not on file  Social Connections:   . Frequency of Communication with Friends and Family: Not on file  . Frequency of Social Gatherings with Friends and Family: Not on file  . Attends Religious Services: Not on file  . Active Member of Clubs or Organizations: Not  on file  . Attends Banker Meetings: Not on file  . Marital Status: Not on file  Intimate Partner Violence:   . Fear of Current or Ex-Partner: Not on file  . Emotionally Abused: Not on file  . Physically Abused: Not on file  . Sexually Abused: Not on file    Outpatient Medications Prior to Visit  Medication Sig Dispense Refill  . doxycycline (VIBRAMYCIN) 100 MG capsule Take 100 mg by mouth daily.    Marland Kitchen escitalopram (LEXAPRO) 10 MG tablet Take 1 tablet (10 mg total) by mouth daily. 30 tablet 2  . hydrocortisone 2.5 % cream APP TO FACE BID FOR 5 TO 7 DAYS    . hydrOXYzine (ATARAX/VISTARIL) 25 MG tablet Take 1 tablet (25 mg total) by mouth every 6 (six) hours as needed for anxiety. 30 tablet 2  . meclizine (ANTIVERT) 12.5 MG tablet Take 1 tablet (12.5 mg total) by mouth 3 (three) times daily as needed for dizziness. 30 tablet 0  . ondansetron (ZOFRAN) 4 MG tablet Take 1 tablet (4 mg total) by mouth every 6 (six) hours. 12 tablet 0  . propranolol ER (INDERAL LA) 80 MG 24 hr capsule Take 1 capsule (  80 mg total) by mouth daily. 30 capsule 6  . RHOFADE 1 % CREA     . SUMAtriptan (IMITREX) 100 MG tablet Take 1 tablet (100 mg total) by mouth every 2 (two) hours as needed for migraine. May repeat in 2 hours if headache persists or recurs. 10 tablet 6   No facility-administered medications prior to visit.      ROS:  Review of Systems BREAST: No symptoms   OBJECTIVE:   Vitals:  There were no vitals taken for this visit.  Physical Exam  Results: No results found for this or any previous visit (from the past 24 hour(s)).   Assessment/Plan: No diagnosis found.    No orders of the defined types were placed in this encounter.     No follow-ups on file.  Yani Lal B. Markevious Ehmke, PA-C 09/06/2019 2:32 PM

## 2019-09-06 NOTE — Telephone Encounter (Signed)
Pt calling; believes she has a bladder inf; know [facility] is trying to cut back on face to face appts.  Do a televox visit or what is protocol.  [phone]  Left detailed msg can be seen by ABC at 4:30 today [facility] office.  To call to confirm.

## 2019-09-07 ENCOUNTER — Encounter: Payer: Self-pay | Admitting: Neurology

## 2019-09-07 NOTE — Telephone Encounter (Signed)
Called and left voice mail for patient to call back to be schedule °

## 2019-09-07 NOTE — Telephone Encounter (Signed)
Patient is schedule for 09/08/19 at 4:10 with CLG

## 2019-09-08 ENCOUNTER — Telehealth: Payer: Self-pay | Admitting: Certified Nurse Midwife

## 2019-09-08 ENCOUNTER — Ambulatory Visit: Payer: 59 | Admitting: Certified Nurse Midwife

## 2019-09-08 NOTE — Telephone Encounter (Signed)
Patient is calling for labs results. Please advise. 

## 2019-09-08 NOTE — Telephone Encounter (Signed)
Renee Wheeler, she cancelled her appointment here today. I did not see her. She called the wrong place.

## 2019-09-11 NOTE — Telephone Encounter (Signed)
Patient cancelled this office due to the co pay. She went to her PCP for her uti

## 2019-09-14 ENCOUNTER — Other Ambulatory Visit: Payer: Self-pay

## 2019-09-14 ENCOUNTER — Encounter: Payer: Self-pay | Admitting: Nurse Practitioner

## 2019-09-14 ENCOUNTER — Ambulatory Visit: Payer: 59 | Admitting: Nurse Practitioner

## 2019-09-14 VITALS — BP 100/62 | HR 73 | Temp 97.3°F | Ht 62.95 in | Wt 142.6 lb

## 2019-09-14 DIAGNOSIS — R300 Dysuria: Secondary | ICD-10-CM

## 2019-09-14 LAB — POCT URINALYSIS DIPSTICK
Blood, UA: NEGATIVE
Glucose, UA: NEGATIVE
Leukocytes, UA: NEGATIVE
Nitrite, UA: NEGATIVE
Protein, UA: NEGATIVE
Spec Grav, UA: 1.025 (ref 1.010–1.025)
Urobilinogen, UA: 0.2 E.U./dL
pH, UA: 6 (ref 5.0–8.0)

## 2019-09-14 LAB — URINALYSIS, MICROSCOPIC ONLY: RBC / HPF: NONE SEEN (ref 0–?)

## 2019-09-14 NOTE — Patient Instructions (Addendum)
It was nice to meet you today.   1. You are having symptoms of a urinary tract infection . Your dipstick urine was negative, but I have sent off a urine culture. If positive, I will order an antibiotic and it will take a couple of days to return. Stop the bubble baths.   2. For your head cold of 2 weeks, [phone] [facility] for Covid testing.   Take Mucinex and Claritin over the counter. If Covid is negative and the symptoms persist, I can order a Z Pak for you. Call me back if symptoms persist.   3. For migraine headache history- no headache now, take Advil with food and see your Neurologist as planned.   Please call me MON with symptom update.

## 2019-09-14 NOTE — Progress Notes (Signed)
Established Patient Office Visit  Subjective:  Patient ID: Renee Wheeler, female    DOB: 1965-11-18  Age: 54 y.o. MRN: [id number]  CC:  Chief Complaint  Patient presents with  . Urinary Tract Infection    HPI Renee Wheeler presents for itching, urgency, frequency, and  burning with voiding. She wants to rule out a UTI. She had BV in the past but no fishy odor, and no vaginal discharge. She has rectal itchiness. She has saw  a DERM in the past in [place] and was Dx as eczema. She has a referral in place for DERM. She is married. 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 No blisters now. She would like a refill of her Valtrex to use prn-a few times a year.   Past Medical History:  Diagnosis Date  . Kidney stones     Past Surgical History:  Procedure Laterality Date  . AUGMENTATION MAMMAPLASTY Bilateral 2000  . HIATAL HERNIA REPAIR      Family History  Problem Relation Age of Onset  . Skin cancer Mother   . Breast cancer Mother 99  . Skin cancer Father   . Stomach cancer Maternal Grandmother     Social History   Socioeconomic History  . Marital status: Married    Spouse name: Not on file  . Number of children: Not on file  . Years of education: Not on file  . Highest education level: Not on file  Occupational History  . Not on file  Tobacco Use  . Smoking status: Never Smoker  . Smokeless tobacco: Never Used  Substance and Sexual Activity  . Alcohol use: Yes    Comment: Occ  . Drug use: Never  . Sexual activity: Yes    Birth control/protection: None, Post-menopausal  Other Topics Concern  . Not on file  Social History Narrative  . Not on file   Social Determinants of Health   Financial Resource Strain:   . Difficulty of Paying Living Expenses:   Food Insecurity:   . Worried About Programme researcher, broadcasting/film/video in the Last Year:   . Barista in the Last Year:   Transportation Needs:   . Freight forwarder (Medical):   Marland Kitchen  Lack of Transportation (Non-Medical):   Physical Activity:   . Days of Exercise per Week:   . Minutes of Exercise per Session:   Stress:   . Feeling of Stress :   Social Connections:   . Frequency of Communication with Friends and Family:   . Frequency of Social Gatherings with Friends and Family:   . Attends Religious Services:   . Active Member of Clubs or Organizations:   . Attends Banker Meetings:   Marland Kitchen Marital Status:   Intimate Partner Violence:   . Fear of Current or Ex-Partner:   . Emotionally Abused:   Marland Kitchen Physically Abused:   . Sexually Abused:     Outpatient Medications Prior to Visit  Medication Sig Dispense Refill  . hydrocortisone 2.5 % cream APP TO FACE BID FOR 5 TO 7 DAYS    . hydrOXYzine (ATARAX/VISTARIL) 25 MG tablet Take 1 tablet (25 mg total) by mouth every 6 (six) hours as needed for anxiety. 30 tablet 2  . ondansetron (ZOFRAN) 4 MG tablet Take 1 tablet (4 mg total) by mouth every 6 (six) hours. 12 tablet 0  . propranolol ER (INDERAL LA) 80 MG 24 hr capsule Take 1 capsule (80  mg total) by mouth daily. 30 capsule 6  . RHOFADE 1 % CREA     . doxycycline (VIBRAMYCIN) 100 MG capsule Take 100 mg by mouth daily.    Marland Kitchen escitalopram (LEXAPRO) 10 MG tablet Take 1 tablet (10 mg total) by mouth daily. (Patient not taking: Reported on 09/14/2019) 30 tablet 2  . meclizine (ANTIVERT) 12.5 MG tablet Take 1 tablet (12.5 mg total) by mouth 3 (three) times daily as needed for dizziness. (Patient not taking: Reported on 09/14/2019) 30 tablet 0  . SUMAtriptan (IMITREX) 100 MG tablet Take 1 tablet (100 mg total) by mouth every 2 (two) hours as needed for migraine. May repeat in 2 hours if headache persists or recurs. (Patient not taking: Reported on 09/14/2019) 10 tablet 6   No facility-administered medications prior to visit.    Allergies  Allergen Reactions  . Latex Rash  . Penicillins Rash   Review of Systems No fever/chill. Neurology appt in May for migraines. She  has been referred to GI for a screening colonoscopy.FH gmother with pancreatic cancer.  Negative screening for depression today. She does have allergies and reports itchy eyes, and drippy nose, not unusual for her. She had both Covid vaccines > 1 month ago. No Covid exposures. She works in a [employer] park manager.    Objective:    Physical Exam  Constitutional: She is oriented to person, place, and time. She appears well-developed and well-nourished.  HENT:  Head: Normocephalic.  Cardiovascular: Normal rate and regular rhythm.  Pulmonary/Chest: Effort normal and breath sounds normal.  Abdominal: Soft. Bowel sounds are normal. There is no abdominal tenderness.  Genitourinary:    No vaginal discharge.     Genitourinary Comments: Rectal visual inspection with normal skin, no rash or lesions.    Musculoskeletal:     Cervical back: Normal range of motion and neck supple.  Neurological: She is alert and oriented to person, place, and time.  Skin: Skin is warm and dry. No rash noted.  Psychiatric: She has a normal mood and affect. Her behavior is normal.    BP 100/62   Pulse 73   Temp (!) 97.3 F (36.3 C) (Temporal)   Ht 5' 2.95" (1.599 m)   Wt 142 lb 9.6 oz (64.7 kg)   SpO2 98%   BMI 25.30 kg/m  Wt Readings from Last 3 Encounters:  09/14/19 142 lb 9.6 oz (64.7 kg)  07/27/19 143 lb (64.9 kg)  07/21/19 143 lb (64.9 kg)    Health Maintenance Due  Topic Date Due  . HIV Screening  Never done  . TETANUS/TDAP  Never done  . COLONOSCOPY  Never done      Assessment & Plan:   Problem List Items Addressed This Visit    None    Visit Diagnoses    Dysuria    -  Primary   Relevant Orders   POCT Urinalysis Dipstick (Completed)    1. You are having symptoms of a urinary tract infection . Your dipstick urine was negative, but I have sent off a urine culture. If positive, I will order an antibiotic and it will take a couple of days to return. Stop the bubble baths.   2. For your head cold of  2 weeks, [phone] [facility] for Covid testing.   Take Mucinex and Claritin over the counter. If Covid is negative and the symptoms persist, I can order a Z Pak for you. Call me back if symptoms persist.   3. For migraine headache history- no  headache now, take Advil with food and see your Neurologist as planned.   Please call me MON with symptom update.   This visit occurred during the SARS-CoV-2 public health emergency.  Safety protocols were in place, including screening questions prior to the visit, additional usage of staff PPE, and extensive cleaning of exam room while observing appropriate contact time as indicated for disinfecting solutions.   Denice Paradise, NP

## 2019-09-15 ENCOUNTER — Telehealth: Payer: Self-pay | Admitting: Nurse Practitioner

## 2019-09-15 ENCOUNTER — Ambulatory Visit: Payer: Managed Care, Other (non HMO) | Admitting: Nurse Practitioner

## 2019-09-15 LAB — EXTRA URINE SPECIMEN

## 2019-09-15 LAB — URINE CULTURE
MICRO NUMBER:: 10266569
SPECIMEN QUALITY:: ADEQUATE

## 2019-09-15 MED ORDER — VALACYCLOVIR HCL 500 MG PO TABS: ORAL_TABLET | ORAL | 0 refills | Status: DC

## 2019-09-15 NOTE — Telephone Encounter (Signed)
I spoke to Renee Wheeler and she is doing well. She does not think she needs to get tested for Covid. She still has  allergies- but no sign of sinus infection or any URI out of the ordinary for her.  Her UA was unremarkable except for slight ketones- did not eat. She had slight bili in urine with fasting but last month a normal liver panel. She is aware of HLD and will address with her PCP. She would like refills of her Valtrex which she takes when she has a HSV - which is very rare.   Treatment, recurrent episode: Oral: 500 mg twice daily for 3 days as needed.  Note: Treatment is most effective when initiated during the prodrome or within 1 day of lesion onset (CDC [[place]owski 2015]).

## 2019-09-15 NOTE — Telephone Encounter (Signed)
Called pt and she stated that she had just gotten off the phone with Fransico Setters, NP. I was not aware so I apologized to pt for calling her again.

## 2019-09-15 NOTE — Telephone Encounter (Signed)
Pt called in and said she just missed Renee Wheeler's call. I didn't see a note so I told her I would have you call her back.

## 2019-09-20 ENCOUNTER — Telehealth: Payer: Self-pay

## 2019-09-20 NOTE — Telephone Encounter (Signed)
Patient returning call, states she is available to pick up until 2 if she could get cb.

## 2019-09-20 NOTE — Telephone Encounter (Signed)
LVMTRC w/details on her issue that she is requesting rx for.

## 2019-09-20 NOTE — Telephone Encounter (Signed)
Patient is returning missed call please advise? 

## 2019-09-20 NOTE — Telephone Encounter (Signed)
Patient requesting cb. Inquiring if [facility] will send in rx for her issue. [phone]

## 2019-09-20 NOTE — Telephone Encounter (Signed)
Spoke w/patient. She is currently having an HSV outbreak. She did see/establish care w/PCP last week 09/15/2019. She had mentioned previous outbreaks but wasn't sure if PCP sent in rx. I advised Valtrex rx was sent in on 09/15/19 by PCP. PCP sent in for outbreak dosing. Patient prefers maintenance dosing. Requesting maintenance dose rx from [facility] sent to [facility].

## 2019-09-22 ENCOUNTER — Other Ambulatory Visit: Payer: Self-pay | Admitting: Obstetrics and Gynecology

## 2019-09-22 MED ORDER — VALACYCLOVIR HCL 1 G PO TABS: 1000.0000 mg | ORAL_TABLET | Freq: Every day | ORAL | 11 refills | Status: DC

## 2019-09-22 NOTE — Telephone Encounter (Signed)
Rx for suppression dose is in

## 2019-09-22 NOTE — Telephone Encounter (Signed)
Pt aware.

## 2019-10-17 ENCOUNTER — Telehealth: Payer: Self-pay

## 2019-10-17 NOTE — Telephone Encounter (Signed)
Pt called triage had questions about Hemorrhoid cream, I left message for pt to return call

## 2019-10-18 ENCOUNTER — Telehealth: Payer: Self-pay

## 2019-10-18 NOTE — Telephone Encounter (Signed)
Pt called triage needing a refill of her hemorrhoid suppository. She ask if a different kind could be called in because the applicator was too small.

## 2019-10-19 NOTE — Telephone Encounter (Signed)
Patient calling today following up, never heard back regarding Rx.  Please advise.

## 2019-10-19 NOTE — Telephone Encounter (Signed)
advise

## 2019-10-20 ENCOUNTER — Other Ambulatory Visit: Payer: Self-pay | Admitting: Obstetrics and Gynecology

## 2019-10-20 MED ORDER — HYDROCORTISONE ACETATE 25 MG RE SUPP: 25.0000 mg | Freq: Two times a day (BID) | RECTAL | 1 refills | Status: DC

## 2019-10-20 NOTE — Telephone Encounter (Signed)
Patient following up. Please advise.

## 2019-10-31 ENCOUNTER — Encounter: Payer: Self-pay | Admitting: Gastroenterology

## 2019-10-31 ENCOUNTER — Other Ambulatory Visit: Payer: Self-pay

## 2019-10-31 ENCOUNTER — Ambulatory Visit (INDEPENDENT_AMBULATORY_CARE_PROVIDER_SITE_OTHER): Payer: 59 | Admitting: Gastroenterology

## 2019-10-31 VITALS — BP 92/57 | HR 69 | Temp 98.1°F | Ht 62.0 in | Wt 141.6 lb

## 2019-10-31 DIAGNOSIS — K581 Irritable bowel syndrome with constipation: Secondary | ICD-10-CM | POA: Diagnosis not present

## 2019-10-31 NOTE — Progress Notes (Signed)
Gastroenterology Consultation  Referring Provider:     Dr. Bonney Aid Primary Care Physician:  Patient, No Pcp Per Primary Gastroenterologist:  Dr. Servando Snare     Reason for Consultation:     Constipation        HPI:   Renee Wheeler is a 54 y.o. y/o female referred for consultation & management of constipation by Dr. Patient, No Pcp Per.  This patient comes in today with a history of constipation.  The patient states that her constipation is so bad that she may not move her bowels for up to a week.  She states that she was born with a hiatal hernia and at 54 years old she had surgery to repair the hiatal hernia.  She also reports that she has tried [place] and a high-fiber diet without much relief. The patient denies having a colonoscopy in the past. She also denies any family history of colon cancer or colon polyps in any first-degree relatives. She was sent for evaluation by her OB/GYN physician. The patient states that when she is constipated she feels very bloated, and states that her abdomen gets distended.  Past Medical History:  Diagnosis Date  . Kidney stones     Past Surgical History:  Procedure Laterality Date  . AUGMENTATION MAMMAPLASTY Bilateral 2000  . HIATAL HERNIA REPAIR      Prior to Admission medications   Medication Sig Start Date End Date Taking? Authorizing Provider  doxycycline (VIBRAMYCIN) 100 MG capsule Take 100 mg by mouth daily. 05/13/19  Yes [provider]  hydrocortisone (ANUSOL-HC) 25 MG suppository Place 1 suppository (25 mg total) rectally 2 (two) times daily. 10/20/19  Yes Vena Austria, MD  ondansetron (ZOFRAN) 4 MG tablet Take 1 tablet (4 mg total) by mouth every 6 (six) hours. 07/27/19  Yes Mickie Bail, NP  pimecrolimus (ELIDEL) 1 % cream APPLY TO FACE TWICE DAILY FOR 6 WEEKS AS NEEDED 10/06/19  Yes [provider]  RHOFADE 1 % CREA  05/23/19  Yes [provider]  valACYclovir (VALTREX) 1000 MG tablet Take 1 tablet (1,000 mg  total) by mouth daily. 09/22/19  Yes Vena Austria, MD  hydrocortisone 2.5 % cream APP TO FACE BID FOR 5 TO 7 DAYS 04/07/19   [provider]  hydrOXYzine (ATARAX/VISTARIL) 25 MG tablet Take 1 tablet (25 mg total) by mouth every 6 (six) hours as needed for anxiety. Patient not taking: Reported on 10/31/2019 07/21/19   Vena Austria, MD  propranolol ER (INDERAL LA) 80 MG 24 hr capsule Take 1 capsule (80 mg total) by mouth daily. Patient not taking: Reported on 10/31/2019 07/21/19   Vena Austria, MD    Family History  Problem Relation Age of Onset  . Skin cancer Mother   . Breast cancer Mother 38  . Skin cancer Father   . Stomach cancer Maternal Grandmother      Social History   Tobacco Use  . Smoking status: Never Smoker  . Smokeless tobacco: Never Used  Substance Use Topics  . Alcohol use: Yes    Comment: Occ  . Drug use: Never    Allergies as of 10/31/2019 - Review Complete 10/31/2019  Allergen Reaction Noted  . Latex Rash 12/27/2018  . Penicillins Rash 05/27/2018    Review of Systems:    All systems reviewed and negative except where noted in HPI.   Physical Exam:  BP (!) 92/57   Pulse 69   Temp 98.1 F (36.7 C) (Oral)   Ht 5'  2" (1.575 m)   Wt 141 lb 9.6 oz (64.2 kg)   BMI 25.90 kg/m  No LMP recorded. Patient is perimenopausal. General:   Alert,  Well-developed, well-nourished, pleasant and cooperative in NAD Head:  Normocephalic and atraumatic. Eyes:  Sclera clear, no icterus.   Conjunctiva pink. Ears:  Normal auditory acuity. Neck:  Supple; no masses or thyromegaly. Lungs:  Respirations even and unlabored.  Clear throughout to auscultation.   No wheezes, crackles, or rhonchi. No acute distress. Heart:  Regular rate and rhythm; no murmurs, clicks, rubs, or gallops. Abdomen:  Normal bowel sounds.  No bruits.  Soft, non-tender and non-distended without masses, hepatosplenomegaly or hernias noted.  No guarding or rebound tenderness.  Negative  Carnett sign.   Rectal:  Deferred.  Pulses:  Normal pulses noted. Extremities:  No clubbing or edema.  No cyanosis. Neurologic:  Alert and oriented x3;  grossly normal neurologically. Skin:  Intact without significant lesions or rashes.  No jaundice. Lymph Nodes:  No significant cervical adenopathy. Psych:  Alert and cooperative. Normal mood and affect.  Imaging Studies: No results found.  Assessment and Plan:   Renee Wheeler is a 54 y.o. y/o female who comes in today with a history of constipation. The patient has been given samples of Linzess to try and see if that helps her with her constipation. The patient has had chronic constipation for some time and is likely suffering from irritable bowel syndrome with constipation predominance. The patient will be set up for a screening colonoscopy. The patient has been explained the plan and agrees with it.    Lucilla Lame, MD. Marval Regal    Note: This dictation was prepared with Dragon dictation along with smaller phrase technology. Any transcriptional errors that result from this process are unintentional.

## 2019-11-02 NOTE — Progress Notes (Signed)
NEUROLOGY CONSULTATION NOTE  Renee Wheeler MRN: [id number] DOB: 1965/11/25  Referring provider: Malachy Mood, MD Primary care provider: No PCP  Reason for consult:  headaches  HISTORY OF PRESENT ILLNESS: Renee Wheeler is a 54 year old right-handed white female who presents for headaches.  History supplemented by referring provider note.  She has had migraines since she was in her 64s.  They are severe pounding and pressure bifrontal headache associated with nausea, photophobia, phonophobia, osmophobia sometimes tinnitus and blurred vision.  No vomiting, numbness or weakness.  They usually last 2 to 4 days.  They have been occurring 5 days a month.  They have been increased by stress.  No other known triggers.  She went to the ED in April 2020 for migraine with associated dizziness and hearing loss.  Current NSAIDS:  none Current analgesics:  none Current triptans:  sumatriptan Current ergotamine:  none Current anti-emetic:  Zofran 4mg  Current muscle relaxants:  none Current anti-anxiolytic:  none Current sleep aide:  none Current Antihypertensive medications:  Propranolol ER 80mg  Current Antidepressant medications:  none Current Anticonvulsant medications:  none Current anti-CGRP:  none Current Vitamins/Herbal/Supplements:  none Current Antihistamines/Decongestants:  none Other therapy:  none Hormone/birth control:  none  Past NSAIDS:  Advil Past analgesics:  Fioricet; Tylenol, Excedrin; tramadol Past abortive triptans:  Eletriptan 40mg ; rizatriptan 38mmg Past abortive ergotamine:  none Past muscle relaxants:  none Past anti-emetic:  none Past antihypertensive medications:  none Past antidepressant medications:  Nortriptyline 10mg ; escitalopram 10mg ; paroxetine Past anticonvulsant medications:  topiramate  Past anti-CGRP:  none Past vitamins/Herbal/Supplements:  none Past antihistamines/decongestants:  meclizine Other past therapies:  none  Caffeine:  1/2 cup  coffee in AM.  Rarely Monster drink Diet:  Not enough water.  Sometimes a lemon soda. Exercise:  Not routine Depression:  yes; Anxiety:  yes Other pain:  no Sleep:  variable Family history of headache:  no  07/21/2019 LABS:  Na 144, K 4.3, Cl 103, CO2 26, glucose 80, BUN 13, Cr 0.64, t bili 0.4, ALP 59, AST 21, ALT 12; TSH 4.710, T4 total 7.8, T3 22, WBC 4.3, HGB 92f4.4, HCT 42.9, PLT 230.   PAST MEDICAL HISTORY: Past Medical History:  Diagnosis Date  . Kidney stones     PAST SURGICAL HISTORY: Past Surgical History:  Procedure Laterality Date  . AUGMENTATION MAMMAPLASTY Bilateral 2000  . HIATAL HERNIA REPAIR      MEDICATIONS: Current Outpatient Medications on File Prior to Visit  Medication Sig Dispense Refill  . doxycycline (VIBRAMYCIN) 100 MG capsule Take 100 mg by mouth daily.    . hydrocortisone (ANUSOL-HC) 25 MG suppository Place 1 suppository (25 mg total) rectally 2 (two) times daily. 12 suppository 1  . hydrocortisone 2.5 % cream APP TO FACE BID FOR 5 TO 7 DAYS    . hydrOXYzine (ATARAX/VISTARIL) 25 MG tablet Take 1 tablet (25 mg total) by mouth every 6 (six) hours as needed for anxiety. (Patient not taking: Reported on 10/31/2019) 30 tablet 2  . ondansetron (ZOFRAN) 4 MG tablet Take 1 tablet (4 mg total) by mouth every 6 (six) hours. 12 tablet 0  . pimecrolimus (ELIDEL) 1 % cream APPLY TO FACE TWICE DAILY FOR 6 WEEKS AS NEEDED    . propranolol ER (INDERAL LA) 80 MG 24 hr capsule Take 1 capsule (80 mg total) by mouth daily. (Patient not taking: Reported on 10/31/2019) 30 capsule 6  . RHOFADE 1 % CREA     . valACYclovir (VALTREX) 1000 MG tablet Take  1 tablet (1,000 mg total) by mouth daily. 30 tablet 11   No current facility-administered medications on file prior to visit.    ALLERGIES: Allergies  Allergen Reactions  . Latex Rash  . Penicillins Rash    FAMILY HISTORY: Family History  Problem Relation Age of Onset  . Skin cancer Mother   . Breast cancer Mother 32    . Skin cancer Father   . Stomach cancer Maternal Grandmother     SOCIAL HISTORY: Social History   Socioeconomic History  . Marital status: Married    Spouse name: Not on file  . Number of children: Not on file  . Years of education: Not on file  . Highest education level: Not on file  Occupational History  . Not on file  Tobacco Use  . Smoking status: Never Smoker  . Smokeless tobacco: Never Used  Substance and Sexual Activity  . Alcohol use: Yes    Comment: Occ  . Drug use: Never  . Sexual activity: Yes    Birth control/protection: None, Post-menopausal  Other Topics Concern  . Not on file  Social History Narrative  . Not on file   Social Determinants of Health   Financial Resource Strain:   . Difficulty of Paying Living Expenses:   Food Insecurity:   . Worried About Programme researcher, broadcasting/film/video in the Last Year:   . Barista in the Last Year:   Transportation Needs:   . Freight forwarder (Medical):   Marland Kitchen Lack of Transportation (Non-Medical):   Physical Activity:   . Days of Exercise per Week:   . Minutes of Exercise per Session:   Stress:   . Feeling of Stress :   Social Connections:   . Frequency of Communication with Friends and Family:   . Frequency of Social Gatherings with Friends and Family:   . Attends Religious Services:   . Active Member of Clubs or Organizations:   . Attends Banker Meetings:   Marland Kitchen Marital Status:   Intimate Partner Violence:   . Fear of Current or Ex-Partner:   . Emotionally Abused:   Marland Kitchen Physically Abused:   . Sexually Abused:     PHYSICAL EXAM: Blood pressure 108/73, pulse 67, height 5\' 6"  (1.676 m), weight 140 lb 9.6 oz (63.8 kg), SpO2 98 %. General: No acute distress.  Patient appears well-groomed.   Head:  Normocephalic/atraumatic Eyes:  fundi examined but not visualized Neck: supple, no paraspinal tenderness, full range of motion Back: No paraspinal tenderness Heart: regular rate and rhythm Lungs: Clear  to auscultation bilaterally. Vascular: No carotid bruits. Neurological Exam: Mental status: alert and oriented to person, place, and time, recent and remote memory intact, fund of knowledge intact, attention and concentration intact, speech fluent and not dysarthric, language intact. Cranial nerves: CN I: not tested CN II: pupils equal, round and reactive to light, visual fields intact CN III, IV, VI:  full range of motion, no nystagmus, no ptosis CN V: facial sensation intact CN VII: upper and lower face symmetric CN VIII: hearing intact CN IX, X: gag intact, uvula midline CN XI: sternocleidomastoid and trapezius muscles intact CN XII: tongue midline Bulk & Tone: normal, no fasciculations. Motor:  5/5 throughout  Sensation:  Pinprick and vibration sensation intact. Deep Tendon Reflexes:  2+ throughout,  toes downgoing.   Finger to nose testing:  Without dysmetria.   Heel to shin:  Without dysmetria.   Gait:  Normal station and stride.  Able  to turn and tandem walk. Romberg negative.  IMPRESSION: Migraine without aura, without status migrainosus, not intractable  PLAN: 1.  Stop propranolol.  Start Emgality for migraine prophylaxis 2.  For abortive therapy, try Nurtec.  Change Zofran 4mg  to ODT. 3.  Limit use of pain relievers to no more than 2 days out of week to prevent risk of rebound or medication-overuse headache. 4.  Keep headache diary 5.  Follow up in 4 months.  Thank you for allowing me to take part in the care of this patient.  , DO

## 2019-11-03 ENCOUNTER — Encounter: Payer: Self-pay | Admitting: Neurology

## 2019-11-03 ENCOUNTER — Ambulatory Visit: Payer: 59 | Admitting: Neurology

## 2019-11-03 ENCOUNTER — Other Ambulatory Visit: Payer: Self-pay

## 2019-11-03 VITALS — BP 108/73 | HR 67 | Ht 66.0 in | Wt 140.6 lb

## 2019-11-03 DIAGNOSIS — G43009 Migraine without aura, not intractable, without status migrainosus: Secondary | ICD-10-CM | POA: Diagnosis not present

## 2019-11-03 MED ORDER — EMGALITY 120 MG/ML ~~LOC~~ SOAJ: 120.0000 mg | SUBCUTANEOUS | 11 refills | Status: DC

## 2019-11-03 MED ORDER — ONDANSETRON 4 MG PO TBDP: 4.0000 mg | ORAL_TABLET | Freq: Three times a day (TID) | ORAL | 4 refills | Status: DC | PRN

## 2019-11-03 NOTE — Patient Instructions (Signed)
1.  Stop propranolol.  Start Emgality injection every 28 days.  First dose is 2 injections.  Keep refrigerated and take out 30 minutes prior to injection. 2.  For rescue, take Nurtec dissolvable tablet (maximum 1 tablet in 24 hours) 3.  I changed zofran to dissolvable tablet 4.  Follow up in 4 months.

## 2019-11-23 ENCOUNTER — Telehealth: Payer: Self-pay

## 2019-11-23 NOTE — Telephone Encounter (Signed)
Pt calling triage requesting a call back for "an issue" she was having. Tried to call pt back, but got the busy tone. Let me know if she calls back.

## 2020-01-08 ENCOUNTER — Telehealth: Payer: Self-pay

## 2020-01-08 NOTE — Telephone Encounter (Signed)
Left second msg can call and sched appt, [facility] an [facility] care [facility] if can't wait for appt with [place].

## 2020-01-08 NOTE — Telephone Encounter (Signed)
Pt calling; thinks she has a bladder inf.  [phone] [facility]

## 2020-01-09 NOTE — Telephone Encounter (Signed)
Please make pt aware she will need to drop off a urine so it can be sent for culture. Nurse visit please. Thank you

## 2020-01-09 NOTE — Telephone Encounter (Signed)
Patient returning call, she says she was told that if she had this kind of problem to let [facility] know, maybe they could just send her something in.  Please advise.

## 2020-01-10 NOTE — Telephone Encounter (Signed)
Msg left 

## 2020-01-15 NOTE — Telephone Encounter (Signed)
Pt hasn't contacted [place].  Msg closed.

## 2020-02-13 ENCOUNTER — Telehealth: Payer: Self-pay | Admitting: Neurology

## 2020-02-13 ENCOUNTER — Other Ambulatory Visit: Payer: Self-pay | Admitting: Neurology

## 2020-02-13 MED ORDER — NURTEC 75 MG PO TBDP: 1.0000 | ORAL_TABLET | Freq: Every day | ORAL | 5 refills | Status: DC | PRN

## 2020-02-13 NOTE — Telephone Encounter (Signed)
Patient states she was given 2 samples of nurtec. The first sample she took was helpful. When she opened the second one today, the pack was empty. She states she has a migraine and is requesting a prescription for the nurtec be sent in to the [facility] on [address] in .

## 2020-02-13 NOTE — Telephone Encounter (Signed)
Done

## 2020-03-20 NOTE — Progress Notes (Signed)
NEUROLOGY FOLLOW UP OFFICE NOTE  Renee Wheeler [id number]  HISTORY OF PRESENT ILLNESS: Renee Wheeler is a 54 year old right-handed white female who follows up for migraines.  UPDATE: In May, we discontinued propranolol and started Emgality.  She tried Nurtec once.  Intensity:  Severe to moderate Duration:  2 hours with Nurtec but residual dull headache that gradually resolved over the next 2 days. Frequency:  2 in past 30 days. Frequency of abortive medication: once Current NSAIDS:  none Current analgesics:  none Current triptans:  sumatriptan Current ergotamine:  none Current anti-emetic:  Zofran ODT 4mg  Current muscle relaxants:  none Current anti-anxiolytic:  none Current sleep aide:  none Current Antihypertensive medications:  none Current Antidepressant medications:  none Current Anticonvulsant medications:  none Current anti-CGRP:  Emgality, Nurtec Current Vitamins/Herbal/Supplements:  none Current Antihistamines/Decongestants:  none Other therapy:  none Hormone/birth control:  none   Caffeine:  1/2 cup coffee in AM.  Rarely Monster drink Diet:  Not enough water.  Sometimes a lemon soda. Exercise:  Not routine Depression:  yes; Anxiety:  yes Other pain:  no Sleep:  variable  HISTORY: She has had migraines since she was in her 30s.  They are severe pounding and pressure bifrontal headache associated with nausea, photophobia, phonophobia, osmophobia sometimes tinnitus and blurred vision.  No vomiting, numbness or weakness.  They usually last 2 to 4 days.  They have been occurring 5 days a month.  They have been increased by stress.  No other known triggers.  She went to the ED in April 2020 for migraine with associated dizziness and hearing loss.   Past NSAIDS:  Advil, Toradol shot (ineffective) Past analgesics:  Fioricet; Tylenol, Excedrin; tramadol Past abortive triptans:  Eletriptan 40mg ; rizatriptan 10mg , sumatriptan tablet Past abortive ergotamine:   none Past muscle relaxants:  none Past anti-emetic:  none Past antihypertensive medications:  propranolol Past antidepressant medications:  Nortriptyline 10mg ; escitalopram 10mg ; paroxetine Past anticonvulsant medications:  topiramate  Past anti-CGRP:  none Past vitamins/Herbal/Supplements:  none Past antihistamines/decongestants:  meclizine Other past therapies:  none   Family history of headache:  no  PAST MEDICAL HISTORY: Past Medical History:  Diagnosis Date  . Kidney stones     MEDICATIONS: Current Outpatient Medications on File Prior to Visit  Medication Sig Dispense Refill  . doxycycline (VIBRAMYCIN) 100 MG capsule Take 100 mg by mouth daily.    . Galcanezumab-gnlm (EMGALITY) 120 MG/ML SOAJ Inject 120 mg into the skin every 28 (twenty-eight) days. 1 pen 11  . hydrocortisone (ANUSOL-HC) 25 MG suppository Place 1 suppository (25 mg total) rectally 2 (two) times daily. 12 suppository 1  . hydrocortisone 2.5 % cream APP TO FACE BID FOR 5 TO 7 DAYS    . hydrOXYzine (ATARAX/VISTARIL) 25 MG tablet Take 1 tablet (25 mg total) by mouth every 6 (six) hours as needed for anxiety. 30 tablet 2  . ondansetron (ZOFRAN ODT) 4 MG disintegrating tablet Take 1 tablet (4 mg total) by mouth every 8 (eight) hours as needed for nausea or vomiting. 20 tablet 4  . ondansetron (ZOFRAN) 4 MG tablet Take 1 tablet (4 mg total) by mouth every 6 (six) hours. 12 tablet 0  . pimecrolimus (ELIDEL) 1 % cream APPLY TO FACE TWICE DAILY FOR 6 WEEKS AS NEEDED    . RHOFADE 1 % CREA     . Rimegepant Sulfate (NURTEC) 75 MG TBDP Take 1 tablet by mouth daily as needed (Maximum 1 tablet in 24 hours). 8 tablet 5  .  valACYclovir (VALTREX) 1000 MG tablet Take 1 tablet (1,000 mg total) by mouth daily. 30 tablet 11   No current facility-administered medications on file prior to visit.    ALLERGIES: Allergies  Allergen Reactions  . Latex Rash  . Penicillins Rash    FAMILY HISTORY: Family History  Problem  Relation Age of Onset  . Skin cancer Mother   . Breast cancer Mother 61  . Skin cancer Father   . Stomach cancer Maternal Grandmother     SOCIAL HISTORY: Social History   Socioeconomic History  . Marital status: Married    Spouse name: Not on file  . Number of children: Not on file  . Years of education: Not on file  . Highest education level: Not on file  Occupational History  . Not on file  Tobacco Use  . Smoking status: Never Smoker  . Smokeless tobacco: Never Used  Vaping Use  . Vaping Use: Never used  Substance and Sexual Activity  . Alcohol use: Yes    Comment: Occ  . Drug use: Never  . Sexual activity: Yes    Birth control/protection: None, Post-menopausal  Other Topics Concern  . Not on file  Social History Narrative   Right handed   Live with husband one story   Social Determinants of Health   Financial Resource Strain:   . Difficulty of Paying Living Expenses: Not on file  Food Insecurity:   . Worried About Programme researcher, broadcasting/film/video in the Last Year: Not on file  . Ran Out of Food in the Last Year: Not on file  Transportation Needs:   . Lack of Transportation (Medical): Not on file  . Lack of Transportation (Non-Medical): Not on file  Physical Activity:   . Days of Exercise per Week: Not on file  . Minutes of Exercise per Session: Not on file  Stress:   . Feeling of Stress : Not on file  Social Connections:   . Frequency of Communication with Friends and Family: Not on file  . Frequency of Social Gatherings with Friends and Family: Not on file  . Attends Religious Services: Not on file  . Active Member of Clubs or Organizations: Not on file  . Attends Banker Meetings: Not on file  . Marital Status: Not on file  Intimate Partner Violence:   . Fear of Current or Ex-Partner: Not on file  . Emotionally Abused: Not on file  . Physically Abused: Not on file  . Sexually Abused: Not on file    PHYSICAL EXAM: Blood pressure 114/77, pulse 74,  resp. rate 18, height 5\' 3"  (1.6 m), weight 145 lb (65.8 kg), SpO2 99 %. General: No acute distress.  Patient appears   IMPRESSION: Migraine without aura, without status migrainosus, not intractable.  Improved with Emgality.  Severe migraine aborts with Nurtec but continued to have a postdrome headache for the next 2 days.  Will try a different rescue medication to see if more effective.  PLAN: 1.  For preventative management, Emgality monthly 2.  For abortive therapy, she will try [name] 100mg . 3.  Limit use of pain relievers to no more than 2 days out of week to prevent risk of rebound or medication-overuse headache. 4.  Keep headache diary 5.  Exercise, hydration, caffeine cessation, sleep hygiene, monitor for and avoid triggers 6. Follow up 4 to 6 months   , DO

## 2020-03-22 ENCOUNTER — Encounter: Payer: Self-pay | Admitting: Neurology

## 2020-03-22 ENCOUNTER — Other Ambulatory Visit: Payer: Self-pay

## 2020-03-22 ENCOUNTER — Ambulatory Visit: Payer: 59 | Admitting: Neurology

## 2020-03-22 VITALS — BP 114/77 | HR 74 | Resp 18 | Ht 63.0 in | Wt 145.0 lb

## 2020-03-22 DIAGNOSIS — G43009 Migraine without aura, not intractable, without status migrainosus: Secondary | ICD-10-CM

## 2020-03-22 NOTE — Patient Instructions (Addendum)
  1. Continue Emgality 2. Take [name] 100mg  at earliest onset of headache.  May repeat dose once in 2 hours if needed.  Maximum 2 tablets in 24 hours. 3. Limit use of pain relievers to no more than 2 days out of the week.  These medications include acetaminophen, NSAIDs (ibuprofen/Advil/Motrin, naproxen/Aleve, triptans (Imitrex/sumatriptan), Excedrin, and narcotics.  This will help reduce risk of rebound headaches. 4. Be aware of common food triggers:  - Caffeine:  coffee, black tea, cola, Mt. Dew  - Chocolate  - Dairy:  aged cheeses (brie, blue, cheddar, gouda, [place], provolone, [place], Swiss, etc), chocolate milk, buttermilk, sour cream, limit eggs and yogurt  - Nuts, peanut butter  - Alcohol  - Cereals/grains:  FRESH breads (fresh bagels, sourdough, doughnuts), yeast productions  - Processed/canned/aged/cured meats (pre-packaged deli meats, hotdogs)  - MSG/glutamate:  soy sauce, flavor enhancer, pickled/preserved/marinated foods  - Sweeteners:  aspartame (Equal, Nutrasweet).  Sugar and Splenda are okay  - Vegetables:  legumes (lima beans, lentils, snow peas, fava beans, pinto peans, peas, garbanzo beans), sauerkraut, onions, olives, pickles  - Fruit:  avocados, bananas, citrus fruit (orange, lemon, grapefruit), mango  - Other:  Frozen meals, macaroni and cheese 5. Routine exercise 6. Stay adequately hydrated (aim for 64 oz water daily) 7. Keep headache diary 8. Maintain proper stress management 9. Maintain proper sleep hygiene 10. Do not skip meals 11.  Consider supplements:  magnesium citrate 400mg  daily, riboflavin 400mg  daily, coenzyme Q10 100mg  three times daily. 12.  Follow up 4 to 6 months.

## 2020-03-27 ENCOUNTER — Telehealth: Payer: Self-pay

## 2020-03-27 NOTE — Telephone Encounter (Signed)
Pt requesting refill for her hemorrhoid medicine. Please advise

## 2020-03-29 ENCOUNTER — Other Ambulatory Visit: Payer: Self-pay | Admitting: Obstetrics and Gynecology

## 2020-03-29 MED ORDER — HYDROCORTISONE ACETATE 25 MG RE SUPP: 25.0000 mg | Freq: Two times a day (BID) | RECTAL | 1 refills | Status: DC

## 2020-03-29 NOTE — Telephone Encounter (Signed)
Patient is calling to follow up on prescription requested. Please advise

## 2020-03-29 NOTE — Telephone Encounter (Signed)
Called in.

## 2020-03-29 NOTE — Telephone Encounter (Signed)
Advise

## 2020-04-03 ENCOUNTER — Other Ambulatory Visit: Payer: Self-pay

## 2020-04-03 MED ORDER — HYDROCORTISONE ACETATE 25 MG RE SUPP: 25.0000 mg | Freq: Two times a day (BID) | RECTAL | 1 refills | Status: DC

## 2020-04-03 NOTE — Telephone Encounter (Signed)
LMVM to notify pt rx sent to [facility] per her request.

## 2020-04-03 NOTE — Telephone Encounter (Signed)
Per Marella Bile, pt inquiring about rx that was supposed to be sent in last week that pharmacy states they haven't received.

## 2020-04-03 NOTE — Telephone Encounter (Signed)
Chart reviewed. Annusol HC suppositories were sent to [facility]. Patient wanted rx sent to [facility]. Permission per [facility]MS to resend to [facility] per pt request.

## 2020-07-26 ENCOUNTER — Other Ambulatory Visit: Payer: Self-pay

## 2020-07-26 ENCOUNTER — Ambulatory Visit (INDEPENDENT_AMBULATORY_CARE_PROVIDER_SITE_OTHER): Payer: 59 | Admitting: Obstetrics and Gynecology

## 2020-07-26 ENCOUNTER — Encounter: Payer: Self-pay | Admitting: Obstetrics and Gynecology

## 2020-07-26 ENCOUNTER — Other Ambulatory Visit (HOSPITAL_COMMUNITY)
Admission: RE | Admit: 2020-07-26 | Discharge: 2020-07-26 | Disposition: A | Discharge status: Home / Self Care | Payer: 59 | Source: Ambulatory Visit | Attending: Obstetrics and Gynecology | Admitting: Obstetrics and Gynecology

## 2020-07-26 VITALS — BP 120/72 | Ht 63.0 in | Wt 147.0 lb

## 2020-07-26 DIAGNOSIS — Z6826 Body mass index (BMI) 26.0-26.9, adult: Secondary | ICD-10-CM | POA: Diagnosis not present

## 2020-07-26 DIAGNOSIS — R5383 Other fatigue: Secondary | ICD-10-CM

## 2020-07-26 DIAGNOSIS — M79673 Pain in unspecified foot: Secondary | ICD-10-CM

## 2020-07-26 DIAGNOSIS — Z1239 Encounter for other screening for malignant neoplasm of breast: Secondary | ICD-10-CM

## 2020-07-26 DIAGNOSIS — Z124 Encounter for screening for malignant neoplasm of cervix: Secondary | ICD-10-CM

## 2020-07-26 DIAGNOSIS — Z01419 Encounter for gynecological examination (general) (routine) without abnormal findings: Secondary | ICD-10-CM | POA: Diagnosis not present

## 2020-07-26 DIAGNOSIS — E663 Overweight: Secondary | ICD-10-CM

## 2020-07-26 DIAGNOSIS — Z1329 Encounter for screening for other suspected endocrine disorder: Secondary | ICD-10-CM

## 2020-07-26 MED ORDER — PHENTERMINE HCL 37.5 MG PO TABS: 37.5000 mg | ORAL_TABLET | Freq: Every day | ORAL | 0 refills | Status: DC

## 2020-07-26 NOTE — Patient Instructions (Signed)
[facility] [address] [place] [address]  [facility] [place]  [address] [place] [address]  Phone: [phone]  

## 2020-07-26 NOTE — Progress Notes (Signed)
Gynecology Annual Exam  PCP: Renee Wheeler, No Pcp Per  Chief Complaint:  Chief Complaint  Renee Wheeler presents with  . Gynecologic Exam    Annual - no concerns. RM 5    History of Present Illness:Renee Wheeler is a 55 y.o. G2P1001 presents for annual exam. The Renee Wheeler has no complaints today.   LMP: No LMP recorded. Renee Wheeler is perimenopausal. No bleeding concerns  The Renee Wheeler is sexually active. She denies dyspareunia.  The Renee Wheeler does perform self breast exams.  There is no notable family history of breast or ovarian cancer in her family.  The Renee Wheeler wears seatbelts: yes.   The Renee Wheeler has regular exercise: not asked.    The Renee Wheeler denies current symptoms of depression.        Patientis a 55 y.o. G60P1001 female, who presents for the evaluation of weight gain. She has gained 10 pounds primarily over 12 months. The Renee Wheeler states the following issues have contributed to her weight problem: decreased activity is being evaluated for possible heel spur or plantar fascitis   The Renee Wheeler has no additional symptoms. The Renee Wheeler specifically denies memory loss, muscle weakness, excessive thirst, and polyuria. Weight related co-morbidities include none. The Renee Wheeler's past medical history is notable for none.   Review of Systems: Review of Systems  Constitutional: Negative for chills and fever.  HENT: Negative for congestion.   Respiratory: Negative for cough and shortness of breath.   Cardiovascular: Negative for chest pain and palpitations.  Gastrointestinal: Negative for abdominal pain, constipation, diarrhea, heartburn, nausea and vomiting.  Genitourinary: Negative for dysuria, frequency and urgency.  Skin: Positive for itching and rash.  Neurological: Negative for dizziness and headaches.  Endo/Heme/Allergies: Negative for polydipsia.  Psychiatric/Behavioral: Negative for depression.    Past Medical History:  Renee Wheeler Active Problem List   Diagnosis Date Noted  . Bacterial vaginosis  12/27/2018  . Migraine 12/27/2018  . HSV (herpes simplex virus) anogenital infection 06/10/2018  . Hypothyroidism 06/10/2018  . Small bowel obstruction (HCC) 09/24/2010    Past Surgical History:  Past Surgical History:  Procedure Laterality Date  . AUGMENTATION MAMMAPLASTY Bilateral 2000  . HIATAL HERNIA REPAIR      Gynecologic History:  No LMP recorded. Renee Wheeler is perimenopausal. Last Pap: Results were:  06/02/2017 NIL 04/17/2016 NIL 02/15/2015 NIL Last mammogram: 08/04/2019 Results were: Elby Showers I  Obstetric History: G2P1001  Family History:  Family History  Problem Relation Age of Onset  . Skin cancer Mother   . Breast cancer Mother 66  . Skin cancer Father   . Stomach cancer Maternal Grandmother     Social History:  Social History   Socioeconomic History  . Marital status: Married    Spouse name: Not on file  . Number of children: Not on file  . Years of education: Not on file  . Highest education level: Not on file  Occupational History  . Not on file  Tobacco Use  . Smoking status: Never Smoker  . Smokeless tobacco: Never Used  Vaping Use  . Vaping Use: Never used  Substance and Sexual Activity  . Alcohol use: Yes    Comment: Occ  . Drug use: Never  . Sexual activity: Yes    Birth control/protection: None, Post-menopausal  Other Topics Concern  . Not on file  Social History Narrative   Right handed   Live with husband one story   Social Determinants of Health   Financial Resource Strain: Not on file  Food Insecurity: Not on file  Transportation  Needs: Not on file  Physical Activity: Not on file  Stress: Not on file  Social Connections: Not on file  Intimate Partner Violence: Not on file    Allergies:  Allergies  Allergen Reactions  . Latex Rash  . Penicillins Rash    Medications: Prior to Admission medications   Medication Sig Start Date End Date Taking? Authorizing Provider  cyclobenzaprine (FLEXERIL) 10 MG tablet cyclobenzaprine 10  mg tablet  TAKE 1 TABLET BY MOUTH EVERY DAY AT BEDTIME    [provider]  Galcanezumab-gnlm (EMGALITY) 120 MG/ML SOAJ Inject 120 mg into the skin every 28 (twenty-eight) days. 11/03/19   Drema Dallas, DO  hydrocortisone (ANUSOL-HC) 25 MG suppository Place 1 suppository (25 mg total) rectally 2 (two) times daily. 04/03/20   Vena Austria, MD  hydrocortisone 2.5 % cream APP TO FACE BID FOR 5 TO 7 DAYS 04/07/19   [provider]  hydrOXYzine (ATARAX/VISTARIL) 25 MG tablet Take 1 tablet (25 mg total) by mouth every 6 (six) hours as needed for anxiety. 07/21/19   Vena Austria, MD  ondansetron (ZOFRAN) 4 MG tablet Take 1 tablet (4 mg total) by mouth every 6 (six) hours. Renee Wheeler not taking: Reported on 03/22/2020 07/27/19   Mickie Bail, NP  pimecrolimus (ELIDEL) 1 % cream APPLY TO FACE TWICE DAILY FOR 6 WEEKS AS NEEDED 10/06/19   [provider]  RHOFADE 1 % CREA  05/23/19   [provider]  Rimegepant Sulfate (NURTEC) 75 MG TBDP Take 1 tablet by mouth daily as needed (Maximum 1 tablet in 24 hours). 02/13/20   Drema Dallas, DO  valACYclovir (VALTREX) 1000 MG tablet Take 1 tablet (1,000 mg total) by mouth daily. 09/22/19   Vena Austria, MD    Physical Exam Vitals: Blood pressure 120/72, height 5\' 3"  (1.6 m), weight 147 lb (66.7 kg). Body mass index is 26.04 kg/m.  General: NAD HEENT: normocephalic, anicteric Thyroid: no enlargement, no palpable nodules Pulmonary: No increased work of breathing, CTAB Cardiovascular: RRR, distal pulses 2+ Breast: Breast symmetrical, no tenderness, no palpable nodules or masses, no skin or nipple retraction present, no nipple discharge.  No axillary or supraclavicular lymphadenopathy. Abdomen: NABS, soft, non-tender, non-distended.  Umbilicus without lesions.  No hepatomegaly, splenomegaly or masses palpable. No evidence of hernia  Genitourinary:  External: Normal external female genitalia.  Normal urethral meatus, normal  Bartholin's and Skene's glands.    Vagina: Normal vaginal mucosa, no evidence of prolapse.    Cervix: Grossly normal in appearance, no bleeding  Uterus: Non-enlarged, mobile, normal contour.  No CMT  Adnexa: ovaries non-enlarged, no adnexal masses  Rectal: deferred  Lymphatic: no evidence of inguinal lymphadenopathy Extremities: no edema, erythema, or tenderness Neurologic: Grossly intact Psychiatric: mood appropriate, affect full  Female chaperone present for pelvic and breast  portions of the physical exam     Assessment: 55 y.o. G2P1001 routine annual exam  Plan: Problem List Items Addressed This Visit   None   Visit Diagnoses    Screening for malignant neoplasm of cervix    -  Primary   Relevant Orders   Cytology - PAP   Breast screening       Relevant Orders   MS DIGITAL SCREENING TOMO W/IMPLANTS BILAT   Encounter for gynecological examination without abnormal finding       Overweight (BMI 25.0-29.9)       Relevant Orders   TSH   BMI 26.0-26.9,adult       Heel pain, unspecified laterality  Other fatigue       Relevant Orders   TSH   Thyroid disorder screening       Relevant Orders   TSH     Annual  1) Mammogram - recommend yearly screening mammogram.  Mammogram Was ordered today  2) STI screening  was not offered and therefore not obtained  3) ASCCP guidelines and rational discussed.  Renee Wheeler opts for discontinue secondary to prior hysterectomy screening interval  4) Osteoporosis  - per USPTF routine screening DEXA at age 53  5) Routine healthcare maintenance including cholesterol, diabetes screening discussed managed by PCP   6) Irritation in posterior fourchette - no evidence of lichen sclerosis.  No evidence of candida.  Some mild erythema.  Agree with trial of low or medium potency steroid   7) Return in about 4 weeks (around 08/23/2020) for medication follow up in person.   Obesity  1) 1500 Calorie ADA Diet  2) Renee Wheeler education given  regarding appropriate lifestyle changes for weight loss including: regular physical activity, healthy coping strategies, caloric restriction and healthy eating patterns.  3) Renee Wheeler will be started on weight loss medication. The risks and benefits and side effects of medication, such as Adipex (Phenteramine) ,  Tenuate (Diethylproprion), Belviq (lorcarsin), Contrave (buproprion/naltrexone), Qsymia (phentermine/topiramate), and Saxenda (liraglutide) is discussed. The pros and cons of suppressing appetite and boosting metabolism is discussed. Risks of tolerence and addiction is discussed for selected agents discussed. Use of medicine will ne short term, such as 3-4 months at a time followed by a period of time off of the medicine to avoid these risks and side effects for Adipex, Qsymia, and Tenuate discussed. Pt to call with any negative side effects and agrees to keep follow up appts.  4) Comorbidity Screening - hypothyroidism screening, diabetes, and hyperlipidemia screening offered  5) Encouraged weekly weight monitorig to track progress and sample 1 week food diary  6) 15 minutes face-to-face; counseling/coordination of care > 50 percent of visit  7) Follow up in 4 weeks to assess response    Vena Austria, MD Domingo Pulse, [facility] 07/26/2020, 9:26 AM

## 2020-07-27 LAB — TSH: TSH: 9.95 u[IU]/mL — ABNORMAL HIGH (ref 0.450–4.500)

## 2020-07-30 LAB — CYTOLOGY - PAP
Adequacy: ABSENT
Comment: NEGATIVE
Diagnosis: NEGATIVE
High risk HPV: NEGATIVE

## 2020-08-05 ENCOUNTER — Other Ambulatory Visit: Payer: Self-pay | Admitting: Obstetrics and Gynecology

## 2020-08-05 DIAGNOSIS — R7989 Other specified abnormal findings of blood chemistry: Secondary | ICD-10-CM

## 2020-08-05 DIAGNOSIS — Z1231 Encounter for screening mammogram for malignant neoplasm of breast: Secondary | ICD-10-CM

## 2020-08-05 NOTE — Progress Notes (Signed)
Needs thryoid panel orders are in

## 2020-08-06 ENCOUNTER — Telehealth: Payer: Self-pay

## 2020-08-06 NOTE — Telephone Encounter (Signed)
-----   Message from Vena Austria, MD sent at 08/05/2020  5:48 PM EST ----- Needs thryoid panel orders are in

## 2020-08-06 NOTE — Telephone Encounter (Signed)
Called and left voicemail for patient to call back to be scheduled. 

## 2020-08-08 ENCOUNTER — Other Ambulatory Visit: Payer: Self-pay | Admitting: Obstetrics and Gynecology

## 2020-08-08 MED ORDER — HYDROCORTISONE ACETATE 25 MG RE SUPP: 25.0000 mg | Freq: Two times a day (BID) | RECTAL | 4 refills | Status: DC

## 2020-08-08 NOTE — Telephone Encounter (Signed)
Patient has labs tomorrow. Inquiring about fasting status/instructions. She never received a refill on her hemorrhoid meds AMS ordered. EM#[phone]

## 2020-08-08 NOTE — Telephone Encounter (Signed)
Rx sent 

## 2020-08-08 NOTE — Telephone Encounter (Signed)
LMVM to notify pt rx refill sent.

## 2020-08-08 NOTE — Telephone Encounter (Signed)
Spoke w/patient. Advised for the Thyroid Panel w/TSH: This test may exhibit interference when sample is collected from a person who is consuming a supplement with a high dose of biotin (also termed as vitamin B7 or B8, vitamin H, or coenzyme R). It is recommended to ask all patients who may be indicated for this test about biotin supplementation. Patients should be cautioned to stop biotin consumption at least 72 hours prior to the collection of a sample.  Patient states she didn't take vitamins yesterday or today. It will be at least 72 hours when she has her labs drawn and her vitamins contain collagen but not biotin. Aware message being sent to AMS for refill request.

## 2020-08-09 ENCOUNTER — Other Ambulatory Visit: Payer: Self-pay

## 2020-08-09 ENCOUNTER — Other Ambulatory Visit: Payer: 59

## 2020-08-09 DIAGNOSIS — R7989 Other specified abnormal findings of blood chemistry: Secondary | ICD-10-CM

## 2020-08-10 LAB — THYROID PANEL WITH TSH
Free Thyroxine Index: 1.4 (ref 1.2–4.9)
T3 Uptake Ratio: 22 % — ABNORMAL LOW (ref 24–39)
T4, Total: 6.3 ug/dL (ref 4.5–12.0)
TSH: 6.97 u[IU]/mL — ABNORMAL HIGH (ref 0.450–4.500)

## 2020-08-12 ENCOUNTER — Other Ambulatory Visit: Payer: Self-pay | Admitting: Obstetrics and Gynecology

## 2020-08-12 ENCOUNTER — Telehealth: Payer: Self-pay

## 2020-08-12 DIAGNOSIS — E038 Other specified hypothyroidism: Secondary | ICD-10-CM

## 2020-08-12 DIAGNOSIS — R7989 Other specified abnormal findings of blood chemistry: Secondary | ICD-10-CM

## 2020-08-12 DIAGNOSIS — R5383 Other fatigue: Secondary | ICD-10-CM

## 2020-08-12 MED ORDER — HYDROCORTISONE ACETATE 25 MG RE SUPP: 25.0000 mg | Freq: Two times a day (BID) | RECTAL | 4 refills | Status: DC

## 2020-08-12 MED ORDER — LEVOTHYROXINE SODIUM 50 MCG PO TABS: 50.0000 ug | ORAL_TABLET | Freq: Every day | ORAL | 5 refills | Status: DC

## 2020-08-12 NOTE — Telephone Encounter (Signed)
Pt calling; auth for med called in last week; was told she would hear something Friday but no response.  [phone]  Left detailed to call with the name of the medication.

## 2020-08-12 NOTE — Progress Notes (Signed)
Needs repeat labs in 8 week order is in

## 2020-08-12 NOTE — Progress Notes (Signed)
subclin

## 2020-08-12 NOTE — Telephone Encounter (Signed)
Called and left voicemail for patient to call back to be scheduled. 

## 2020-08-12 NOTE — Telephone Encounter (Signed)
-----   Message from Vena Austria, MD sent at 08/12/2020  1:56 PM EST ----- Needs repeat labs in 8 week order is in

## 2020-08-13 ENCOUNTER — Telehealth: Payer: Self-pay

## 2020-08-13 ENCOUNTER — Other Ambulatory Visit: Payer: Self-pay | Admitting: Obstetrics and Gynecology

## 2020-08-13 MED ORDER — HYDROCORTISONE (PERIANAL) 2.5 % EX CREA: 1.0000 "application " | TOPICAL_CREAM | Freq: Two times a day (BID) | CUTANEOUS | 3 refills | Status: DC

## 2020-08-13 NOTE — Telephone Encounter (Signed)
Pt calling again regarding rx.  [phone]

## 2020-08-13 NOTE — Telephone Encounter (Signed)
Pt called in this am.  See message.

## 2020-08-13 NOTE — Telephone Encounter (Signed)
Pt calling; ins is not covering the hemorrhoid cream or suppositories.  [phone]

## 2020-08-14 LAB — SPECIMEN STATUS REPORT

## 2020-08-14 LAB — THYROID PEROXIDASE ANTIBODY: Thyroperoxidase Ab SerPl-aCnc: 318 IU/mL — ABNORMAL HIGH (ref 0–34)

## 2020-08-20 NOTE — Telephone Encounter (Signed)
Rx sent in by AMS.

## 2020-08-21 ENCOUNTER — Telehealth: Payer: Self-pay

## 2020-08-21 NOTE — Telephone Encounter (Signed)
Patient calling regarding her 08/23/20 f/u apt for meds in person. She states he started on several meds and she's only been taking them for about a week. Inquiring if needs to keep or reschedule. WY#[phone]

## 2020-08-21 NOTE — Telephone Encounter (Signed)
LMVM TRC to discuss med f/u apt. Per 07/26/20 visit note instructions: 3) Patient will be started on weight loss medication. The risks and benefits and side effects of medication, such as Adipex (Phenteramine) ,  Tenuate (Diethylproprion), Belviq (lorcarsin), Contrave (buproprion/naltrexone), Qsymia (phentermine/topiramate), and Saxenda (liraglutide) is discussed. The pros and cons of suppressing appetite and boosting metabolism is discussed. Risks of tolerence and addiction is discussed for selected agents discussed. Use of medicine will ne short term, such as 3-4 months at a time followed by a period of time off of the medicine to avoid these risks and side effects for Adipex, Qsymia, and Tenuate discussed. Pt to call with any negative side effects and agrees to keep follow up appts.  7) Return in about 4 weeks (around 08/23/2020) for medication follow up in person.

## 2020-08-22 NOTE — Telephone Encounter (Signed)
LMVM to advise 08/23/20 apt is f/u for weight loss meds. Keep apt if it has been a month since she has started these. R/S apt for 1 month after starting meds if she didn't start them 1 month ago.

## 2020-08-23 ENCOUNTER — Ambulatory Visit: Payer: 59 | Admitting: Obstetrics and Gynecology

## 2020-08-30 ENCOUNTER — Ambulatory Visit
Admission: RE | Admit: 2020-08-30 | Discharge: 2020-08-30 | Disposition: A | Discharge status: Home / Self Care | Payer: 59 | Source: Ambulatory Visit | Attending: Obstetrics and Gynecology | Admitting: Obstetrics and Gynecology

## 2020-08-30 ENCOUNTER — Other Ambulatory Visit: Payer: Self-pay

## 2020-08-30 DIAGNOSIS — Z1231 Encounter for screening mammogram for malignant neoplasm of breast: Secondary | ICD-10-CM | POA: Insufficient documentation

## 2020-09-04 NOTE — Progress Notes (Signed)
NEUROLOGY FOLLOW UP OFFICE NOTE  Renee Wheeler [id number]  Assessment/Plan:   Migraine without aura, without status migrainosus, not intractable  1.  Migraine prevention:  Emgality every 28 days 2.  Migraine rescue:  She will try [name] again, knowing that she may repeat dose if needed.  Zofran for nausea as needed. 3.  Limit use of pain relievers to no more than 2 days out of week to prevent risk of rebound or medication-overuse headache. 4.  Keep headache diary 5.  Follow up 6 months  Subjective:  Renee Wheeler is a 64 year oldright-handed white female who follows up for migraines.  UPDATE: No difference with [name] but never repeated dose. Intensity:  Severe to moderate Duration:  2 hours but residual dull headache that gradually resolved over the next 2 days. Frequency:  2 - 3 a month (2 mild, 1 severe day before next injection) Frequency of abortive medication: once Current NSAIDS:none Current analgesics:none Current triptans:sumatriptan Current ergotamine:none Current anti-emetic:Zofran ODT 4mg  Current muscle relaxants:none Current anti-anxiolytic:none Current sleep aide:none Current Antihypertensive medications:none Current Antidepressant medications:none Current Anticonvulsant medications:none Current anti-CGRP:Emgality, [name] 100mg  Current Vitamins/Herbal/Supplements:none Current Antihistamines/Decongestants:none Other therapy:none Hormone/birth control:none   Caffeine:1/2 cup coffee in AM. Rarely Monster drink Diet:Not enough water. Sometimes a lemon soda. Exercise:Not routine Depression:yes; Anxiety:yes Other pain:no Sleep: variable  HISTORY: She has had migraines since she was in her 30s. They are severe pounding and pressure bifrontal headache associated with nausea, photophobia, phonophobia, osmophobia sometimes tinnitus and blurred vision. No vomiting, numbness or weakness. They usually  last 2 to 4 days. They have been occurring 5 days a month. They have been increased by stress. No other known triggers. She went to the ED in April 2020 for migraine with associated dizziness and hearing loss.   Past NSAIDS:Advil, Toradol shot (ineffective) Past analgesics:Fioricet; Tylenol, Excedrin; tramadol Past abortive triptans:Eletriptan 40mg ; rizatriptan 10mg , sumatriptan tablet Past abortive ergotamine:none Past muscle relaxants:none Past anti-emetic:none Past antihypertensive medications:propranolol Past antidepressant medications:Nortriptyline 10mg ; escitalopram 10mg ; paroxetine Past anticonvulsant medications:topiramate Past anti-CGRP:Nurtec (rescue, helped) Past vitamins/Herbal/Supplements:none Past antihistamines/decongestants:meclizine Other past therapies:none   Family history of headache:no  PAST MEDICAL HISTORY: Past Medical History:  Diagnosis Date  . Kidney stones     MEDICATIONS: Current Outpatient Medications on File Prior to Visit  Medication Sig Dispense Refill  . Galcanezumab-gnlm (EMGALITY) 120 MG/ML SOAJ Inject 120 mg into the skin every 28 (twenty-eight) days. 1 pen 11  . hydrocortisone (PROCTOZONE-HC) 2.5 % rectal cream Place 1 application rectally 2 (two) times daily. 30 g 3  . hydrocortisone 2.5 % cream APP TO FACE BID FOR 5 TO 7 DAYS    . hydrOXYzine (ATARAX/VISTARIL) 25 MG tablet Take 1 tablet (25 mg total) by mouth every 6 (six) hours as needed for anxiety. 30 tablet 2  . levothyroxine (SYNTHROID) 50 MCG tablet Take 1 tablet (50 mcg total) by mouth daily before breakfast. 30 tablet 5  . meloxicam (MOBIC) 15 MG tablet Take 15 mg by mouth daily.    . phentermine (ADIPEX-P) 37.5 MG tablet Take 1 tablet (37.5 mg total) by mouth daily before breakfast. 30 tablet 0  . RHOFADE 1 % CREA     . Rimegepant Sulfate (NURTEC) 75 MG TBDP Take 1 tablet by mouth daily as needed (Maximum 1 tablet in 24 hours). 8 tablet 5  .  valACYclovir (VALTREX) 1000 MG tablet Take 1 tablet (1,000 mg total) by mouth daily. 30 tablet 11   No current facility-administered medications on file prior to visit.  ALLERGIES: Allergies  Allergen Reactions  . Latex Rash  . Penicillins Rash    FAMILY HISTORY: Family History  Problem Relation Age of Onset  . Skin cancer Mother   . Breast cancer Mother 21  . Skin cancer Father   . Stomach cancer Maternal Grandmother       Objective:  Blood pressure 122/81, pulse 70, height 5\' 3"  (1.6 m), weight 142 lb 12.8 oz (64.8 kg), SpO2 99 %. General: No acute distress.  Patient appears well-groomed.     , DO

## 2020-09-06 ENCOUNTER — Ambulatory Visit: Payer: 59 | Admitting: Neurology

## 2020-09-06 ENCOUNTER — Other Ambulatory Visit: Payer: Self-pay

## 2020-09-06 ENCOUNTER — Encounter: Payer: Self-pay | Admitting: Neurology

## 2020-09-06 VITALS — BP 122/81 | HR 70 | Ht 63.0 in | Wt 142.8 lb

## 2020-09-06 DIAGNOSIS — G43009 Migraine without aura, not intractable, without status migrainosus: Secondary | ICD-10-CM

## 2020-09-06 MED ORDER — EMGALITY 120 MG/ML ~~LOC~~ SOAJ: 120.0000 mg | SUBCUTANEOUS | 5 refills | Status: DC

## 2020-09-06 NOTE — Patient Instructions (Signed)
1.  Continue Emgality every 28 days 2.  Earliest onset of migraine, take [name].  May repeat in 2 hours.  Maximum 2 tablets in 24 hours 3.  Limit use of pain relievers to no more than 2 days out of week to prevent risk of rebound or medication-overuse headache. 4. Keep headache diary 5.  Follow up 6 months.

## 2020-09-20 ENCOUNTER — Ambulatory Visit: Payer: 59 | Admitting: Obstetrics and Gynecology

## 2020-10-14 ENCOUNTER — Telehealth: Payer: Self-pay

## 2020-10-14 NOTE — Telephone Encounter (Signed)
Pt calling regarding thyroid medication AMS rx'd.  [phone]

## 2020-10-15 ENCOUNTER — Telehealth: Payer: Self-pay

## 2020-10-21 ENCOUNTER — Other Ambulatory Visit: Payer: Self-pay | Admitting: Obstetrics and Gynecology

## 2020-10-21 DIAGNOSIS — E038 Other specified hypothyroidism: Secondary | ICD-10-CM

## 2020-10-21 DIAGNOSIS — R768 Other specified abnormal immunological findings in serum: Secondary | ICD-10-CM

## 2020-10-21 MED ORDER — FLUCONAZOLE 150 MG PO TABS: 150.0000 mg | ORAL_TABLET | Freq: Once | ORAL | 0 refills | Status: AC

## 2020-10-21 NOTE — Telephone Encounter (Signed)
I've sent the diflucan in and I've sent in a referral for endocrinology

## 2020-10-24 ENCOUNTER — Telehealth: Payer: Self-pay

## 2020-10-24 NOTE — Telephone Encounter (Signed)
Pt calling; AMS rx thyroid med and put in ref to Endocrinologist; they can't get her in until end of July almost August.  Doesn't know if she needs to be on medication between now and then but .....(unable to hear what is said) .Marland Kitchen..off completely.  [phone]

## 2020-10-24 NOTE — Telephone Encounter (Signed)
See msg of 10/24/20.

## 2020-10-24 NOTE — Telephone Encounter (Signed)
If she feels that she has an allergic reaction to the synthroid which is what I use for thyroid then stay off until she's seen endocrinology

## 2020-10-25 NOTE — Telephone Encounter (Signed)
Left detailed msg.

## 2020-10-29 NOTE — Telephone Encounter (Signed)
Patient is calling back to follow up on this message. Patient didn't get voicemail. Would you please contact patient so she knows what the plan is?

## 2020-10-29 NOTE — Telephone Encounter (Signed)
LMTC

## 2020-11-04 NOTE — Telephone Encounter (Signed)
Left detailed msg if feels like she is allergic to stay off until she sees Endo; otherwise, stay on it until sees Endo and they can change her dose.  (VM intro identified pt.)

## 2020-12-06 ENCOUNTER — Telehealth: Payer: Self-pay | Admitting: Neurology

## 2020-12-06 ENCOUNTER — Other Ambulatory Visit: Payer: Self-pay

## 2020-12-06 MED ORDER — UBRELVY 100 MG PO TABS: ORAL_TABLET | ORAL | 0 refills | Status: DC

## 2020-12-06 NOTE — Telephone Encounter (Signed)
Sent rx in for 16 tablets of ubrelvy no refills.

## 2020-12-06 NOTE — Telephone Encounter (Signed)
Pt needs the ubrelvy called into the [facility]

## 2020-12-20 ENCOUNTER — Telehealth: Payer: Self-pay

## 2020-12-20 NOTE — Telephone Encounter (Signed)
Pt calling; unable to hear msg.; what was heard sounded like pt needs refill of valacyclovir  [phone]  [facility] for clarity.

## 2020-12-23 NOTE — Telephone Encounter (Signed)
Pt calling for rx refill.  [phone]  Left detailed msg to call with exactly what medication she needs refilled.

## 2020-12-24 ENCOUNTER — Other Ambulatory Visit: Payer: Self-pay | Admitting: Obstetrics and Gynecology

## 2020-12-24 MED ORDER — VALACYCLOVIR HCL 1 G PO TABS: 1000.0000 mg | ORAL_TABLET | Freq: Every day | ORAL | 11 refills | Status: DC

## 2020-12-24 NOTE — Telephone Encounter (Signed)
Patient is calling requesting refill for valACYclovir. To [facility] [address] Please advise

## 2020-12-24 NOTE — Telephone Encounter (Signed)
This is the first message I got.  Rx has been sent

## 2020-12-24 NOTE — Telephone Encounter (Addendum)
Pt calling for refill; this is the 3rd message.  [phone] valacyclovir refill

## 2020-12-25 NOTE — Telephone Encounter (Signed)
Left msg for pt; rx sent in; it is my fault she had to call three times; I thought I had sent it to AMS but I had not.

## 2021-01-10 ENCOUNTER — Ambulatory Visit: Payer: 59 | Admitting: Endocrinology

## 2021-01-10 ENCOUNTER — Encounter: Payer: Self-pay | Admitting: Endocrinology

## 2021-01-10 ENCOUNTER — Other Ambulatory Visit: Payer: Self-pay

## 2021-01-10 VITALS — BP 108/62 | HR 69 | Ht 63.0 in | Wt 140.0 lb

## 2021-01-10 DIAGNOSIS — E039 Hypothyroidism, unspecified: Secondary | ICD-10-CM

## 2021-01-10 MED ORDER — LEVOTHYROXINE SODIUM 75 MCG PO TABS: 75.0000 ug | ORAL_TABLET | Freq: Every day | ORAL | 3 refills | Status: DC

## 2021-01-10 NOTE — Progress Notes (Signed)
Subjective:    Patient ID: Renee Wheeler, female    DOB: 1966-06-07, 55 y.o.   MRN: [id number]  HPI Pt is referred by Dr Bonney Aid, for hypothyroidism.  Pt reports hypothyroidism was dx'ed in 2015.  she stopped synthroid 2 mos ago, as she feels this is causing hair loss.  She says this problem did not happen when she took levothyroxine a few years ago, though.  she has never taken kelp or any other type of non-prescribed thyroid product.  she has never had thyroid surgery, or XRT to the neck.  she has never been on amiodarone or lithium.  She also has fatigue, dry skin, mild depression, and weight gain.    Past Medical History:  Diagnosis Date   Kidney stones     Past Surgical History:  Procedure Laterality Date   AUGMENTATION MAMMAPLASTY Bilateral 2000   HIATAL HERNIA REPAIR      Social History   Socioeconomic History   Marital status: Married    Spouse name: Not on file   Number of children: Not on file   Years of education: Not on file   Highest education level: Not on file  Occupational History   Not on file  Tobacco Use   Smoking status: Never   Smokeless tobacco: Never  Vaping Use   Vaping Use: Never used  Substance and Sexual Activity   Alcohol use: Yes    Comment: Occ   Drug use: Never   Sexual activity: Yes    Birth control/protection: None, Post-menopausal  Other Topics Concern   Not on file  Social History Narrative   Right handed   Live with husband one story   Social Determinants of Health   Financial Resource Strain: Not on file  Food Insecurity: Not on file  Transportation Needs: Not on file  Physical Activity: Not on file  Stress: Not on file  Social Connections: Not on file  Intimate Partner Violence: Not on file    Current Outpatient Medications on File Prior to Visit  Medication Sig Dispense Refill   Galcanezumab-gnlm (EMGALITY) 120 MG/ML SOAJ Inject 120 mg into the skin every 28 (twenty-eight) days. 1 mL 5   hydrocortisone  (PROCTOZONE-HC) 2.5 % rectal cream Place 1 application rectally 2 (two) times daily. 30 g 3   hydrocortisone 2.5 % cream APP TO FACE BID FOR 5 TO 7 DAYS     hydrOXYzine (ATARAX/VISTARIL) 25 MG tablet Take 1 tablet (25 mg total) by mouth every 6 (six) hours as needed for anxiety. 30 tablet 2   meloxicam (MOBIC) 15 MG tablet Take 15 mg by mouth daily.     RHOFADE 1 % CREA      Rimegepant Sulfate (NURTEC) 75 MG TBDP Take 1 tablet by mouth daily as needed (Maximum 1 tablet in 24 hours). 8 tablet 5   Ubrogepant ([name]) 100 MG TABS Take one tablet on onset of migraine, max two tablets in 24 hours 16 tablet 0   valACYclovir (VALTREX) 1000 MG tablet Take 1 tablet (1,000 mg total) by mouth daily. 30 tablet 11   No current facility-administered medications on file prior to visit.    Allergies  Allergen Reactions   Latex Rash   Penicillins Rash    Family History  Problem Relation Age of Onset   Skin cancer Mother    Breast cancer Mother 58   Thyroid disease Mother    Skin cancer Father    Stomach cancer Maternal Grandmother     BP 108/62  Pulse 69   Ht 5\' 3"  (1.6 m)   Wt 140 lb (63.5 kg)   SpO2 97%   BMI 24.80 kg/m     Review of Systems denies difficulty with concentration, and cold intolerance.      Objective:   Physical Exam VS: see vs page GEN: no distress HEAD: head: no deformity eyes: no periorbital swelling, no proptosis external nose and ears are normal NECK: thyroid is not enlarged, but no palpable nodule CHEST WALL: no deformity LUNGS: clear to auscultation CV: reg rate and rhythm, no murmur.  MUSCULOSKELETAL: gait is normal and steady EXTEMITIES: no deformity.  no leg edema NEURO:  readily moves all 4's.  sensation is intact to touch on all 4's SKIN:  Normal texture and temperature.  No rash or suspicious lesion is visible.   NODES:  None palpable at the neck.   PSYCH: alert, well-oriented.  Does not appear anxious nor depressed.    (2016): Scattered  small thyroid nodules bilaterally with very low suspicion pattern. Nonodules meeting criteria for FNA.   I have reviewed outside records, and summarized: Pt was noted to have hypothyroidism and referred here.  She was also seen for headache, and this was stable.      Assessment & Plan:  Hypothyroidism: uncontrolled.  Hair loss: I told pt this is more likely related to undertreatment or autoimmunity than synthroid.   Patient Instructions  Blood tests are requested for you today.  We'll let you know about the results.   Based on the results, I'll prescribe for you a different amount of the medication.   Please come back for a follow-up appointment in 1 year.

## 2021-01-10 NOTE — Patient Instructions (Addendum)
Blood tests are requested for you today.  We'll let you know about the results.   Based on the results, I'll prescribe for you a different amount of the medication.   Please come back for a follow-up appointment in 1 year.

## 2021-01-22 ENCOUNTER — Telehealth: Payer: Self-pay

## 2021-01-22 NOTE — Telephone Encounter (Signed)
Pt calling for a call back.  [phone]  Left msg to call c reason for call.

## 2021-01-23 NOTE — Telephone Encounter (Signed)
Pt calling for call back.  [phone]  Pt saw endocrinologist for her thyroid care; nothing was explained to her nor were her questions answered; she was told to come back in a year with no medication changes nor any blood work; there are orders in computer for thyroid; pt is frustrated; also needs to know how much vitamin D she needs to take for vit D deficiency.  Really wants to talk c Dr. Bonney Aid; plans to get blood drawn tomorrow.  Ok to leave vm c dosage of vitamin D.

## 2021-01-24 NOTE — Telephone Encounter (Signed)
So she has subclinical hypothyroidism so the thyroid hormone levels are normal but the TSH is elevated.  If symptomatic these can be treated with levothyroxine but don't need to be.  She did not tolerate the levothyroxine so there is no other treatment option available which I why she was sent to endocrinology.  They must a) felt she didn't need supplementation based on current symptoms and number b) there was no alternative since she reported intolerance to the levothyroxine  Per USPTF she should take up to 1000mg  of caclium a day and 400 international units of vitamin D.

## 2021-01-28 ENCOUNTER — Other Ambulatory Visit: Payer: Self-pay | Admitting: Neurology

## 2021-02-07 ENCOUNTER — Other Ambulatory Visit: Payer: Self-pay

## 2021-02-07 ENCOUNTER — Other Ambulatory Visit: Payer: Self-pay | Admitting: Endocrinology

## 2021-02-07 ENCOUNTER — Other Ambulatory Visit: Payer: 59

## 2021-02-07 DIAGNOSIS — E038 Other specified hypothyroidism: Secondary | ICD-10-CM

## 2021-02-07 DIAGNOSIS — E039 Hypothyroidism, unspecified: Secondary | ICD-10-CM

## 2021-02-07 DIAGNOSIS — R5383 Other fatigue: Secondary | ICD-10-CM

## 2021-02-07 DIAGNOSIS — R7989 Other specified abnormal findings of blood chemistry: Secondary | ICD-10-CM

## 2021-02-07 NOTE — Telephone Encounter (Signed)
Patient states lab can only see the order from Dr. Bonney Aid. Upon review although her Thryoid w/TSH and Free T4 were ordered for a future date, they were ordered as CLINIC COLLECT instead of LAB COLLECT. Therefore, the requisition # did not populate. I was unaware prior to releasing the lab. Advised patient she would need to contact Endocrinology to get this corrected as I can not addend those orders.

## 2021-02-07 NOTE — Telephone Encounter (Signed)
Patient transferred by front desk. She has very little signal at her house and is unable to connect to my chart. She's following up on previous call. Advised of AMS response. Patient inquired where she could get her blood drawn. Advised of local [facility] ([facility] by Karin Golden). Patient is currently on [address]. I released her future ordered Thyroid labs. Thyroid w/TSH, Free T4 (Dr. Everardo All), Thyroid Peroxidase Antibody (by Dr. Bonney Aid).

## 2021-02-08 LAB — THYROID PEROXIDASE ANTIBODY: Thyroperoxidase Ab SerPl-aCnc: 326 IU/mL — ABNORMAL HIGH (ref 0–34)

## 2021-02-15 LAB — TSH+FREE T4
Free T4 by Dialysis: 1.3 ng/dL
TSH: 1.4 uU/mL

## 2021-02-25 ENCOUNTER — Telehealth: Payer: Self-pay

## 2021-02-25 NOTE — Telephone Encounter (Signed)
Pt calling for lab results; does not have access to MyChart.  Please call [phone]

## 2021-02-25 NOTE — Telephone Encounter (Signed)
You can let her know that her TSH value is now normal so thyroid labs looked good

## 2021-02-26 ENCOUNTER — Telehealth: Payer: Self-pay | Admitting: Neurology

## 2021-02-26 NOTE — Telephone Encounter (Signed)
Pt tx'd to me from front desk; pt aware of normal labs; pt asked about medication; adv nothing was said about medication so keep it the same; pt asked about how much vitamin D to take; adv 400IUs.

## 2021-02-26 NOTE — Telephone Encounter (Signed)
Pt called in stating she is supposed to take her Emgality shot later today, but she woke up this morning with a migraine and threw up. She went a head and took [place] this morning and a second dose recently. She is not sure if she can still take her Emgality later today? The pamphlet says not to exceed 2 [name] in 24 hours.

## 2021-02-27 NOTE — Telephone Encounter (Signed)
Pt called and spoke to the Access nurse, Pt wanted to know if she could take Ubrlevy with Emgality.   LMOVM Charissa Bash is the abortive medication she should take as needed the Shot is the preventive medication every 28 days.

## 2021-02-28 ENCOUNTER — Ambulatory Visit (INDEPENDENT_AMBULATORY_CARE_PROVIDER_SITE_OTHER): Payer: 59

## 2021-02-28 ENCOUNTER — Ambulatory Visit: Payer: 59 | Admitting: Podiatry

## 2021-02-28 ENCOUNTER — Other Ambulatory Visit: Payer: Self-pay

## 2021-02-28 DIAGNOSIS — M722 Plantar fascial fibromatosis: Secondary | ICD-10-CM | POA: Diagnosis not present

## 2021-02-28 MED ORDER — BETAMETHASONE SOD PHOS & ACET 6 (3-3) MG/ML IJ SUSP
3.0000 mg | Freq: Once | INTRAMUSCULAR | Status: AC
  Administered 2021-02-28: 3 mg via INTRA_ARTICULAR

## 2021-02-28 MED ORDER — METHYLPREDNISOLONE 4 MG PO TBPK: ORAL_TABLET | ORAL | 0 refills | Status: DC

## 2021-02-28 MED ORDER — MELOXICAM 15 MG PO TABS: 15.0000 mg | ORAL_TABLET | Freq: Every day | ORAL | 1 refills | Status: DC

## 2021-02-28 NOTE — Progress Notes (Signed)
   Subjective: 55 y.o. female presenting to the office today as a new patient for evaluation of bilateral heel pain is been going on for 1-2 years.  Gradual onset.  She is seen orthopedist in the past who prescribed prednisone pack which only helped temporarily and then cortisone shot was administered.  Patient states she got relief from the cortisone for about 1 month.  Other than that she is tried icing her foot with minimal improvement.   Past Medical History:  Diagnosis Date   Kidney stones      Objective: Physical Exam General: The patient is alert and oriented x3 in no acute distress.  Dermatology: Skin is warm, dry and supple bilateral lower extremities. Negative for open lesions or macerations bilateral.   Vascular: Dorsalis Pedis and Posterior Tibial pulses palpable bilateral.  Capillary fill time is immediate to all digits.  Neurological: Epicritic and protective threshold intact bilateral.   Musculoskeletal: Tenderness to palpation to the plantar aspect of the bilateral heels along the plantar fascia. All other joints range of motion within normal limits bilateral. Strength 5/5 in all groups bilateral.   Radiographic exam: Normal osseous mineralization. Joint spaces preserved. No fracture/dislocation/boney destruction. No other soft tissue abnormalities or radiopaque foreign bodies.   Assessment: 1. plantar fasciitis bilateral feet; 1-2 years  Plan of Care:  1. Patient evaluated. Xrays reviewed.   2. Injection of 0.5cc Celestone soluspan injected into the bilateral heels.  3. Rx for Medrol Dose Pak placed 4. Rx for Meloxicam ordered for patient. 5.  Continue wearing plantar fascial braces that the patient has at home 6. Instructed patient regarding therapies and modalities at home to alleviate symptoms.  7.  Advised against going barefoot as this aggravates her heel pain.  Patient admits to going barefoot throughout the day at home  8.  Return to clinic in 4 weeks.     *Dental assistant  Felecia Shelling, DPM [facility]  Dr. Felecia Shelling, DPM    [address] [place], [place] [address]                Office [phone]  Fax [phone]

## 2021-03-20 NOTE — Progress Notes (Signed)
NEUROLOGY FOLLOW UP OFFICE NOTE  Renee Wheeler [id number]  Assessment/Plan:   Migraine without aura, without status migrainosus, not intractable  Migraine prevention:  Emgality every 28 days Migraine rescue:  Will have her try [name] with Renee Wheeler or try as second line to see if it can help abort the residual headache as well Limit use of pain relievers to no more than 2 days out of week to prevent risk of rebound or medication-overuse headache. Keep headache diary Follow up 6 months   Subjective:  Renee Wheeler is a 55 year old right-handed white female who follows up for migraines.   UPDATE: She retried Renee Wheeler knowing that she can repeat dose.  It helps.  It will abort after the second dose (sometimes the first dose) but still has residual but functioning headache for 2 days.  She had one migraine associated with sinuses that would not break with 2 [name]. Intensity:  Severe to moderate Duration:  2 hours but residual dull headache that gradually resolved over the next 2 days. Frequency:  2 - 3 a month (2 mild, 1 severe day before next injection) Frequency of abortive medication: once Current NSAIDS:  none Current analgesics:  none Current triptans:  sumatriptan Current ergotamine:  none Current anti-emetic:  Zofran ODT 4mg  Current muscle relaxants:  none Current anti-anxiolytic:  none Current sleep aide:  none Current Antihypertensive medications:  none Current Antidepressant medications:  none Current Anticonvulsant medications:  none Current anti-CGRP:  Emgality, [name] 100mg  Current Vitamins/Herbal/Supplements:  none Current Antihistamines/Decongestants:  none Other therapy:  none Hormone/birth control:  none     Caffeine:  1/2 cup coffee in AM.  Rarely Monster drink Diet:  Not enough water.  Sometimes a lemon soda. Exercise:  Not routine Depression:  yes; Anxiety:  yes Other pain:  no Sleep:  variable   HISTORY:  She has had migraines since she was in  her 30s.  They are severe pounding and pressure bifrontal headache associated with nausea, photophobia, phonophobia, osmophobia sometimes tinnitus and blurred vision.  No vomiting, numbness or weakness.  They usually last 2 to 4 days.  They have been occurring 5 days a month.  They have been increased by stress.  No other known triggers.  She went to the ED in April 2020 for migraine with associated dizziness and hearing loss.     Past NSAIDS:  Advil, Toradol shot (ineffective) Past analgesics:  Fioricet; Tylenol, Excedrin; tramadol Past abortive triptans:  Eletriptan 40mg ; rizatriptan 10mg , sumatriptan tablet Past abortive ergotamine:  none Past muscle relaxants:  none Past anti-emetic:  none Past antihypertensive medications:  propranolol Past antidepressant medications:  Nortriptyline 10mg ; escitalopram 10mg ; paroxetine Past anticonvulsant medications:  topiramate  Past anti-CGRP:  Nurtec (rescue, helped) Past vitamins/Herbal/Supplements:  none Past antihistamines/decongestants:  meclizine Other past therapies:  none     Family history of headache:  no  PAST MEDICAL HISTORY: Past Medical History:  Diagnosis Date   Kidney stones     MEDICATIONS: Current Outpatient Medications on File Prior to Visit  Medication Sig Dispense Refill   EMGALITY 120 MG/ML SOAJ ADMINISTER 1 ML UNDER THE SKIN EVERY 28 DAYS 1 mL 5   fluconazole (DIFLUCAN) 150 MG tablet Take by mouth.     hydrocortisone (PROCTOZONE-HC) 2.5 % rectal cream Place 1 application rectally 2 (two) times daily. 30 g 3   hydrocortisone 2.5 % cream APP TO FACE BID FOR 5 TO 7 DAYS     hydrocortisone 2.5 % cream Place rectally.  hydrOXYzine (ATARAX/VISTARIL) 25 MG tablet Take 1 tablet (25 mg total) by mouth every 6 (six) hours as needed for anxiety. 30 tablet 2   levothyroxine (SYNTHROID) 50 MCG tablet Take by mouth.     levothyroxine (SYNTHROID) 75 MCG tablet Take 1 tablet (75 mcg total) by mouth daily. 90 tablet 3   meloxicam  (MOBIC) 15 MG tablet Take 1 tablet (15 mg total) by mouth daily. 30 tablet 1   methylPREDNISolone (MEDROL DOSEPAK) 4 MG TBPK tablet 6 day dose pack - take as directed 21 tablet 0   RHOFADE 1 % CREA      Rimegepant Sulfate (NURTEC) 75 MG TBDP Take 1 tablet by mouth daily as needed (Maximum 1 tablet in 24 hours). 8 tablet 5   Ubrogepant ([name]) 100 MG TABS Take one tablet on onset of migraine, max two tablets in 24 hours 16 tablet 0   valACYclovir (VALTREX) 1000 MG tablet Take 1 tablet (1,000 mg total) by mouth daily. 30 tablet 11   No current facility-administered medications on file prior to visit.    ALLERGIES: Allergies  Allergen Reactions   Latex Rash   Penicillins Rash    FAMILY HISTORY: Family History  Problem Relation Age of Onset   Skin cancer Mother    Breast cancer Mother 54   Thyroid disease Mother    Skin cancer Father    Stomach cancer Maternal Grandmother       Objective:  Blood pressure (!) 140/93, pulse 74, height 5\' 4"  (1.626 m), weight 140 lb 3.2 oz (63.6 kg), SpO2 99 %.   , DO

## 2021-03-21 ENCOUNTER — Other Ambulatory Visit: Payer: Self-pay

## 2021-03-21 ENCOUNTER — Ambulatory Visit: Payer: 59 | Admitting: Neurology

## 2021-03-21 ENCOUNTER — Encounter: Payer: Self-pay | Admitting: Neurology

## 2021-03-21 VITALS — BP 140/93 | HR 74 | Ht 64.0 in | Wt 140.2 lb

## 2021-03-21 DIAGNOSIS — G43009 Migraine without aura, not intractable, without status migrainosus: Secondary | ICD-10-CM | POA: Diagnosis not present

## 2021-03-21 NOTE — Patient Instructions (Signed)
Continue Emgality every 28 days Take Elxyb once with [name].  You may still repeat [name] alone in 2 hours.  Alternatively, you can try taking [name] alone but use [name] as a second line if headache not improved in a 1/2 hour after second [name] Follow up 6 months.

## 2021-03-26 NOTE — Telephone Encounter (Signed)
Msg closed

## 2021-04-04 ENCOUNTER — Encounter: Payer: Self-pay | Admitting: Podiatry

## 2021-04-04 ENCOUNTER — Ambulatory Visit (INDEPENDENT_AMBULATORY_CARE_PROVIDER_SITE_OTHER): Payer: 59 | Admitting: Podiatry

## 2021-04-04 ENCOUNTER — Other Ambulatory Visit: Payer: Self-pay

## 2021-04-04 DIAGNOSIS — M722 Plantar fascial fibromatosis: Secondary | ICD-10-CM

## 2021-04-04 NOTE — Progress Notes (Signed)
   Subjective: 55 y.o. female presenting to the office today as a new patient for evaluation of bilateral heel pain is been going on for 1-2 years.  Gradual onset.  She is seen orthopedist in the past who prescribed prednisone pack which only helped temporarily and then cortisone shot was administered.  Patient states she got relief from the cortisone for about 1 month.  Other than that she is tried icing her foot with minimal improvement.   Past Medical History:  Diagnosis Date   Kidney stones      Objective: Physical Exam General: The patient is alert and oriented x3 in no acute distress.  Dermatology: Skin is warm, dry and supple bilateral lower extremities. Negative for open lesions or macerations bilateral.   Vascular: Dorsalis Pedis and Posterior Tibial pulses palpable bilateral.  Capillary fill time is immediate to all digits.  Neurological: Epicritic and protective threshold intact bilateral.   Musculoskeletal: Tenderness to palpation to the plantar aspect of the bilateral heels along the plantar fascia. All other joints range of motion within normal limits bilateral. Strength 5/5 in all groups bilateral.   Radiographic exam: Normal osseous mineralization. Joint spaces preserved. No fracture/dislocation/boney destruction. No other soft tissue abnormalities or radiopaque foreign bodies.   Assessment: 1. plantar fasciitis bilateral feet; 1-2 years  Plan of Care:  1. Patient evaluated 2.  The patient has now had the bilateral heel pain for almost 2 years now despite multiple treatment modalities and multiple opinions from different physicians.  I do think it would be appropriate at this time to order an MRI to definitively diagnose the patient's symptoms and of plantar fasciitis. 3.  MRI right heel ordered at [facility] imaging 4.  In the meantime continue all conservative treatment options including meloxicam as needed 5.  Return to clinic after MRI to review results and  discuss further treatment options. 6.  Today we did discuss some additional treatment options including EPAT shockwave, PRP and stem cell injections, or ultimately surgery.  Unfortunately all of these modalities do have cost associated factors which we discussed today.  *Dental assistant  Felecia Shelling, DPM [facility]  Dr. Felecia Shelling, DPM    [address] [place], [place] [address]                Office [phone]  Fax [phone]

## 2021-04-08 ENCOUNTER — Telehealth: Payer: Self-pay | Admitting: Podiatry

## 2021-04-08 NOTE — Telephone Encounter (Signed)
Pt. Call and said she has not got a call for a appt. For her MRI

## 2021-04-09 NOTE — Telephone Encounter (Signed)
Order has been placed for MRI. Please follow up. Thanks, Dr. Logan Bores

## 2021-04-25 ENCOUNTER — Ambulatory Visit
Admission: RE | Admit: 2021-04-25 | Discharge: 2021-04-25 | Disposition: A | Discharge status: Home / Self Care | Payer: 59 | Source: Ambulatory Visit | Attending: Podiatry | Admitting: Podiatry

## 2021-04-25 ENCOUNTER — Other Ambulatory Visit: Payer: Self-pay

## 2021-04-25 DIAGNOSIS — M722 Plantar fascial fibromatosis: Secondary | ICD-10-CM

## 2021-05-16 ENCOUNTER — Ambulatory Visit: Payer: 59 | Admitting: Podiatry

## 2021-05-20 ENCOUNTER — Other Ambulatory Visit: Payer: Self-pay | Admitting: Podiatry

## 2021-05-30 ENCOUNTER — Ambulatory Visit: Payer: 59 | Admitting: Podiatry

## 2021-07-09 ENCOUNTER — Other Ambulatory Visit: Payer: Self-pay | Admitting: Neurology

## 2021-07-28 ENCOUNTER — Encounter: Payer: Self-pay | Admitting: Obstetrics and Gynecology

## 2021-07-28 ENCOUNTER — Ambulatory Visit (INDEPENDENT_AMBULATORY_CARE_PROVIDER_SITE_OTHER): Payer: 59 | Admitting: Obstetrics and Gynecology

## 2021-07-28 ENCOUNTER — Other Ambulatory Visit: Payer: Self-pay

## 2021-07-28 VITALS — BP 102/64 | Ht 64.0 in | Wt 145.0 lb

## 2021-07-28 DIAGNOSIS — R399 Unspecified symptoms and signs involving the genitourinary system: Secondary | ICD-10-CM

## 2021-07-28 DIAGNOSIS — Z01419 Encounter for gynecological examination (general) (routine) without abnormal findings: Secondary | ICD-10-CM | POA: Diagnosis not present

## 2021-07-28 DIAGNOSIS — Z1211 Encounter for screening for malignant neoplasm of colon: Secondary | ICD-10-CM

## 2021-07-28 DIAGNOSIS — Z1231 Encounter for screening mammogram for malignant neoplasm of breast: Secondary | ICD-10-CM

## 2021-07-28 DIAGNOSIS — A6004 Herpesviral vulvovaginitis: Secondary | ICD-10-CM

## 2021-07-28 LAB — POCT URINALYSIS DIPSTICK
Bilirubin, UA: NEGATIVE
Blood, UA: NEGATIVE
Glucose, UA: NEGATIVE
Ketones, UA: NEGATIVE
Nitrite, UA: NEGATIVE
Protein, UA: NEGATIVE
Spec Grav, UA: 1.025 (ref 1.010–1.025)
pH, UA: 5 (ref 5.0–8.0)

## 2021-07-28 MED ORDER — VALACYCLOVIR HCL 500 MG PO TABS
500.0000 mg | ORAL_TABLET | Freq: Two times a day (BID) | ORAL | 1 refills | Status: AC
Start: 2021-07-28 — End: 2021-07-31

## 2021-07-28 MED ORDER — NITROFURANTOIN MONOHYD MACRO 100 MG PO CAPS: 100.0000 mg | ORAL_CAPSULE | Freq: Two times a day (BID) | ORAL | 0 refills | Status: AC

## 2021-07-28 NOTE — Progress Notes (Signed)
PCP: Patient, No Pcp Per (Inactive)   Chief Complaint  Patient presents with   Gynecologic Exam   Urinary Tract Infection    Frequency urinary, vag discomfort x 1-2 weeks    HPI:      [name] is a 56 y.o. G2P1001 whose LMP was No LMP recorded. Patient is perimenopausal., presents today for her annual examination.  Her menses are absent due to menopause, no PMB. She does have vasomotor sx; didn't like how she felt on HRT.   Sex activity: not sexually active. She does not have vaginal dryness.  Last Pap: 07/26/20 Results were: no abnormalities /neg HPV DNA.  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   Had recent genital outbreak last wk, taking valtrex. Has had urinary frequency and urgency, with and without good flow, as well as dysuria/pressure. No hematuria, LBP, fevers, no vag sx except itch with HSV lesions. Hx of UTIs in past.   Last mammogram: 08/30/20  Results were: normal--routine follow-up in 12 months There is a FH of breast cancer in her mother, genetic testing not indicated. There is no FH of ovarian cancer. The patient does do self-breast exams.  Colonoscopy: never; did referral a few yrs ago but never did test  Tobacco use: The patient denies current or previous tobacco use. Alcohol use: none No drug use Exercise: not active  She does get adequate calcium but not Vitamin D in her diet. Hx of Vit D deficiency.  Labs with PCP.   Patient Active Problem List   Diagnosis Date Noted   Bacterial vaginosis 12/27/2018   Migraine 12/27/2018   HSV (herpes simplex virus) anogenital infection 06/10/2018   Hypothyroidism 06/10/2018   Small bowel obstruction ([place]) 09/24/2010    Past Surgical History:  Procedure Laterality Date   AUGMENTATION MAMMAPLASTY Bilateral 2000   HIATAL HERNIA REPAIR      Family History  Problem Relation Age of Onset   Skin cancer Mother    Breast cancer Mother 72   Thyroid disease Mother    Skin  cancer Father    Stomach cancer Maternal Grandmother     Social History   Socioeconomic History   Marital status: Married    Spouse name: Not on file   Number of children: Not on file   Years of education: Not on file   Highest education level: Not on file  Occupational History   Not on file  Tobacco Use   Smoking status: Never   Smokeless tobacco: Never  Vaping Use   Vaping Use: Never used  Substance and Sexual Activity   Alcohol use: Yes    Comment: Occ   Drug use: Never   Sexual activity: Not Currently    Birth control/protection: None, Post-menopausal  Other Topics Concern   Not on file  Social History Narrative   Right handed   Live with husband one story   Social Determinants of Health   Financial Resource Strain: Not on file  Food Insecurity: Not on file  Transportation Needs: Not on file  Physical Activity: Not on file  Stress: Not on file  Social Connections: Not on file  Intimate Partner Violence: Not on file     Current Outpatient Medications:    busPIRone (BUSPAR) 5 MG tablet, Take 5 mg by mouth daily., Disp: , Rfl:    EMGALITY 120 MG/ML SOAJ, ADMINISTER 1 ML(120 MG) UNDER THE SKIN EVERY 28 DAYS, Disp: 1 mL, Rfl: 2  escitalopram (LEXAPRO) 20 MG tablet, Take 20 mg by mouth daily., Disp: , Rfl:    hydrocortisone (PROCTOZONE-HC) 2.5 % rectal cream, Place 1 application rectally 2 (two) times daily., Disp: 30 g, Rfl: 3   hydrocortisone 2.5 % cream, APP TO FACE BID FOR 5 TO 7 DAYS, Disp: , Rfl:    hydrocortisone 2.5 % cream, Place rectally., Disp: , Rfl:    hydrOXYzine (ATARAX/VISTARIL) 25 MG tablet, Take 1 tablet (25 mg total) by mouth every 6 (six) hours as needed for anxiety., Disp: 30 tablet, Rfl: 2   meloxicam (MOBIC) 15 MG tablet, TAKE 1 TABLET(15 MG) BY MOUTH DAILY, Disp: 30 tablet, Rfl: 1   nitrofurantoin, macrocrystal-monohydrate, (MACROBID) 100 MG capsule, Take 1 capsule (100 mg total) by mouth 2 (two) times daily for 5 days., Disp: 10 capsule,  Rfl: 0   PRED FORTE 1 % ophthalmic suspension, Place 1 drop into the left eye 3 (three) times daily., Disp: , Rfl:    predniSONE (DELTASONE) 20 MG tablet, Take 20 mg by mouth daily., Disp: , Rfl:    RHOFADE 1 % CREA, , Disp: , Rfl:    Rimegepant Sulfate (NURTEC) 75 MG TBDP, Take 1 tablet by mouth daily as needed (Maximum 1 tablet in 24 hours)., Disp: 8 tablet, Rfl: 5   Ubrogepant ([name]) 100 MG TABS, Take one tablet on onset of migraine, max two tablets in 24 hours, Disp: 16 tablet, Rfl: 0   valACYclovir (VALTREX) 500 MG tablet, Take 1 tablet (500 mg total) by mouth 2 (two) times daily for 3 days. Prn sx, Disp: 30 tablet, Rfl: 1     ROS:  Review of Systems  Constitutional:  Negative for fatigue, fever and unexpected weight change.  Respiratory:  Negative for cough, shortness of breath and wheezing.   Cardiovascular:  Negative for chest pain, palpitations and leg swelling.  Gastrointestinal:  Negative for blood in stool, constipation, diarrhea, nausea and vomiting.  Endocrine: Negative for cold intolerance, heat intolerance and polyuria.  Genitourinary:  Positive for dysuria, frequency and urgency. Negative for dyspareunia, flank pain, genital sores, hematuria, menstrual problem, pelvic pain, vaginal bleeding, vaginal discharge and vaginal pain.  Musculoskeletal:  Negative for back pain, joint swelling and myalgias.  Skin:  Negative for rash.  Neurological:  Negative for dizziness, syncope, light-headedness, numbness and headaches.  Hematological:  Negative for adenopathy.  Psychiatric/Behavioral:  Positive for agitation and dysphoric mood. Negative for confusion, sleep disturbance and suicidal ideas. The patient is not nervous/anxious.   BREAST: No symptoms    Objective: BP 102/64    Ht 5\' 4"  (1.626 m)    Wt 145 lb (65.8 kg)    BMI 24.89 kg/m    Physical Exam Constitutional:      Appearance: She is well-developed.  Genitourinary:     Vulva normal.     Right Labia: No rash,  tenderness or lesions.    Left Labia: No tenderness, lesions or rash.    No vaginal discharge, erythema or tenderness.      Right Adnexa: not tender and no mass present.    Left Adnexa: not tender and no mass present.    No cervical friability or polyp.     Uterus is not enlarged or tender.  Breasts:    Right: No mass, nipple discharge, skin change or tenderness.     Left: No mass, nipple discharge, skin change or tenderness.  Neck:     Thyroid: No thyromegaly.  Cardiovascular:     Rate and Rhythm: Normal  rate and regular rhythm.     Heart sounds: Normal heart sounds. No murmur heard. Pulmonary:     Effort: Pulmonary effort is normal.     Breath sounds: Normal breath sounds.  Abdominal:     Palpations: Abdomen is soft.     Tenderness: There is no abdominal tenderness. There is no guarding or rebound.  Musculoskeletal:        General: Normal range of motion.     Cervical back: Normal range of motion.  Lymphadenopathy:     Cervical: No cervical adenopathy.  Neurological:     General: No focal deficit present.     Mental Status: She is alert and oriented to person, place, and time.     Cranial Nerves: No cranial nerve deficit.  Skin:    General: Skin is warm and dry.  Psychiatric:        Mood and Affect: Mood normal.        Behavior: Behavior normal.        Thought Content: Thought content normal.        Judgment: Judgment normal.  Vitals reviewed.    Results: Results for orders placed or performed in visit on 07/28/21 (from the past 24 hour(s))  POCT Urinalysis Dipstick     Status: Abnormal   Collection Time: 07/28/21  3:32 PM  Result Value Ref Range   Color, UA yellow    Clarity, UA clear    Glucose, UA Negative Negative   Bilirubin, UA neg    Ketones, UA neg    Spec Grav, UA 1.025 1.010 - 1.025   Blood, UA neg    pH, UA 5.0 5.0 - 8.0   Protein, UA Negative Negative   Urobilinogen, UA     Nitrite, UA neg    Leukocytes, UA Small (1+) (A) Negative   Appearance      Odor      Assessment/Plan:  Encounter for annual routine gynecological examination  Encounter for screening mammogram for malignant neoplasm of breast - Plan: MM 3D SCREEN BREAST BILATERAL; pt to [facility]  Screening for colon cancer - Plan: Ambulatory referral to Gastroenterology; refer to GI due to age  UTI symptoms - Plan: POCT Urinalysis Dipstick, Urine Culture, nitrofurantoin, macrocrystal-monohydrate, (MACROBID) 100 MG capsule; pos sx and UA. Check C&S. Rx macrobid. F/u prn.   Herpes simplex vulvovaginitis - Plan: valACYclovir (VALTREX) 500 MG tablet; Rx RF episodically.    Meds ordered this encounter  Medications   nitrofurantoin, macrocrystal-monohydrate, (MACROBID) 100 MG capsule    Sig: Take 1 capsule (100 mg total) by mouth 2 (two) times daily for 5 days.    Dispense:  10 capsule    Refill:  0    Order Specific Question:   Supervising Provider    Answer:   Gae Dry [id number]   valACYclovir (VALTREX) 500 MG tablet    Sig: Take 1 tablet (500 mg total) by mouth 2 (two) times daily for 3 days. Prn sx    Dispense:  30 tablet    Refill:  1    Order Specific Question:   Supervising Provider    Answer:   Gae Dry [id number]            GYN counsel breast self exam, mammography screening, menopause, adequate intake of calcium and vitamin D, diet and exercise    F/U  Return in about 1 year (around 07/28/2022).  Renee Wheeler B. Elden Brucato, PA-C 07/28/2021 3:34 PM

## 2021-07-29 ENCOUNTER — Telehealth: Payer: Self-pay

## 2021-07-29 NOTE — Telephone Encounter (Signed)
CALLED PATIENT NO ANSWER LEFT VOICEMAIL FOR A CALL BACK ? ?

## 2021-07-30 ENCOUNTER — Telehealth: Payer: Self-pay

## 2021-07-30 LAB — URINE CULTURE

## 2021-07-30 NOTE — Telephone Encounter (Signed)
CALLED PATIENT NO ANSWER LEFT VOICEMAIL FOR A CALL BACK °Letter sent °

## 2021-09-08 ENCOUNTER — Telehealth: Payer: Self-pay

## 2021-09-08 NOTE — Telephone Encounter (Signed)
Pt calling for a call back.  [phone]  Left detailed msg to call back and either leave a detailed msg on nurse line or ask receptionist for me so we won't play phone tag. ?

## 2021-09-10 ENCOUNTER — Ambulatory Visit
Admission: RE | Admit: 2021-09-10 | Discharge: 2021-09-10 | Disposition: A | Discharge status: Home / Self Care | Payer: 59 | Source: Ambulatory Visit | Attending: Obstetrics and Gynecology | Admitting: Obstetrics and Gynecology

## 2021-09-10 ENCOUNTER — Other Ambulatory Visit: Payer: Self-pay

## 2021-09-10 DIAGNOSIS — Z1231 Encounter for screening mammogram for malignant neoplasm of breast: Secondary | ICD-10-CM

## 2021-09-17 NOTE — Telephone Encounter (Signed)
Left second detailed msg. 

## 2021-09-22 NOTE — Telephone Encounter (Signed)
Pt needs appt

## 2021-09-22 NOTE — Telephone Encounter (Signed)
Pt returning call; is still having itchiness.  [phone] ?

## 2021-09-23 NOTE — Telephone Encounter (Signed)
Contacted patient via phone. Left voicemail for patient to call back to scheduled. 

## 2021-09-24 ENCOUNTER — Telehealth: Payer: Self-pay | Admitting: Obstetrics and Gynecology

## 2021-09-24 DIAGNOSIS — L290 Pruritus ani: Secondary | ICD-10-CM

## 2021-09-24 MED ORDER — CLOTRIMAZOLE-BETAMETHASONE 1-0.05 % EX CREA: TOPICAL_CREAM | CUTANEOUS | 0 refills | Status: DC

## 2021-09-24 NOTE — Telephone Encounter (Signed)
Pt called. Having viaginal and rectal/"butt crack" itching for a few wks. Had HSV outbreak that resolved, but now with extreme itching. Has seen derm in past and given several crms that didn't work. Told to use OTC athlete's foot cream in past but that caused burning. Pt has hx of hemorrhoids but sx not the same. Leaving tomorrow AM to go out of town to take care of her mom. I'm off this afternoon. Will try Rx lotrisone crm vag and rectally/gluteal cleft, cool compresses. F/u prn. May need to see derm again if sx persist since I'm on vacation next wk.  ?

## 2021-10-08 ENCOUNTER — Ambulatory Visit: Payer: 59 | Admitting: Neurology

## 2021-10-14 ENCOUNTER — Telehealth: Payer: Self-pay

## 2021-10-14 NOTE — Telephone Encounter (Signed)
Pt calling for refill of hemorrhoid med and refill of yeast med.  [phone] ?

## 2021-10-14 NOTE — Progress Notes (Signed)
? ?NEUROLOGY FOLLOW UP OFFICE NOTE ? ?[name] ?[id number] ? ?Assessment/Plan:  ? ?Migraine without aura, without status migrainosus, not intractable ?  ?Migraine prevention:  Emgality every 28 days ?Migraine rescue:  [name] 100mg  ?Limit use of pain relievers to no more than 2 days out of week to prevent risk of rebound or medication-overuse headache. ?Keep headache diary ?Follow up 1 year ?  ?  ?Subjective:  ?Renee Wheeler is a 56 year old right-handed white female who follows up for migraines. ?  ?UPDATE: ?Intensity:  Severe to moderate ?Duration:  2 hours without postdrome headache ?Frequency:  1 in last 30 days ?Frequency of abortive medication: once ?Current NSAIDS:  none ?Current analgesics:  none ?Current triptans:  sumatriptan ?Current ergotamine:  none ?Current anti-emetic:  Zofran ODT 4mg  ?Current muscle relaxants:  none ?Current anti-anxiolytic:  none ?Current sleep aide:  none ?Current Antihypertensive medications:  none ?Current Antidepressant medications:  none ?Current Anticonvulsant medications:  none ?Current anti-CGRP:  Emgality, [name] 100mg  ?Current Vitamins/Herbal/Supplements:  none ?Current Antihistamines/Decongestants:  none ?Other therapy:  none ?Hormone/birth control:  none ?  ?  ?Caffeine:  1/2 cup coffee in AM.  Rarely Monster drink ?Diet:  Not enough water.  Sometimes a lemon soda. ?Exercise:  Not routine ?Depression:  yes; Anxiety:  yes ?Other pain:  no ?Sleep:  variable ?  ?HISTORY:  ?She has had migraines since she was in her 45s.  They are severe pounding and pressure bifrontal headache associated with nausea, photophobia, phonophobia, osmophobia sometimes tinnitus and blurred vision.  No vomiting, numbness or weakness.  They usually last 2 to 4 days.  They have been occurring 5 days a month.  They have been increased by stress.  No other known triggers.  She went to the ED in April 2020 for migraine with associated dizziness and hearing loss. ?  ?  ?Past NSAIDS:  Advil,  Toradol shot (ineffective), [name] ?Past analgesics:  Fioricet; Tylenol, Excedrin; tramadol ?Past abortive triptans:  Eletriptan 40mg ; rizatriptan 10mg , sumatriptan tablet ?Past abortive ergotamine:  none ?Past muscle relaxants:  none ?Past anti-emetic:  none ?Past antihypertensive medications:  propranolol ?Past antidepressant medications:  Nortriptyline 10mg ; escitalopram 10mg ; paroxetine ?Past anticonvulsant medications:  topiramate  ?Past anti-CGRP:  Nurtec (rescue, helped but Roselyn Meier more effective) ?Past vitamins/Herbal/Supplements:  none ?Past antihistamines/decongestants:  meclizine ?Other past therapies:  none ?  ?  ?Family history of headache:  no ? ?PAST MEDICAL HISTORY: ?Past Medical History:  ?Diagnosis Date  ? Kidney stones   ? ? ?MEDICATIONS: ?Current Outpatient Medications on File Prior to Visit  ?Medication Sig Dispense Refill  ? busPIRone (BUSPAR) 5 MG tablet Take 5 mg by mouth daily.    ? clotrimazole-betamethasone (LOTRISONE) cream Apply externally BID prn sx up to 2 wks 15 g 0  ? EMGALITY 120 MG/ML SOAJ ADMINISTER 1 ML(120 MG) UNDER THE SKIN EVERY 28 DAYS 1 mL 2  ? escitalopram (LEXAPRO) 20 MG tablet Take 20 mg by mouth daily.    ? hydrocortisone (PROCTOZONE-HC) 2.5 % rectal cream Place 1 application rectally 2 (two) times daily. 30 g 3  ? hydrocortisone 2.5 % cream APP TO FACE BID FOR 5 TO 7 DAYS    ? hydrocortisone 2.5 % cream Place rectally.    ? hydrOXYzine (ATARAX/VISTARIL) 25 MG tablet Take 1 tablet (25 mg total) by mouth every 6 (six) hours as needed for anxiety. 30 tablet 2  ? meloxicam (MOBIC) 15 MG tablet TAKE 1 TABLET(15 MG) BY MOUTH DAILY 30 tablet 1  ?  PRED FORTE 1 % ophthalmic suspension Place 1 drop into the left eye 3 (three) times daily.    ? predniSONE (DELTASONE) 20 MG tablet Take 20 mg by mouth daily.    ? RHOFADE 1 % CREA     ? Rimegepant Sulfate (NURTEC) 75 MG TBDP Take 1 tablet by mouth daily as needed (Maximum 1 tablet in 24 hours). 8 tablet 5  ? Ubrogepant ([name]) 100  MG TABS Take one tablet on onset of migraine, max two tablets in 24 hours 16 tablet 0  ? ?No current facility-administered medications on file prior to visit.  ? ? ?ALLERGIES: ?Allergies  ?Allergen Reactions  ? Latex Rash  ? Penicillins Rash  ? ? ?FAMILY HISTORY: ?Family History  ?Problem Relation Age of Onset  ? Skin cancer Mother   ? Breast cancer Mother 8  ? Thyroid disease Mother   ? Skin cancer Father   ? Stomach cancer Maternal Grandmother   ? ? ?  ?Objective:  ?Blood pressure 122/78, pulse (!) 57, height 5\' 4"  (1.626 m), weight 149 lb (67.6 kg), SpO2 98 %. ?General: No acute distress.  Patient appears well-groomed.   ?Head:  Normocephalic/atraumatic ?Eyes:  Fundi examined but not visualized ?Neck: supple, no paraspinal tenderness, full range of motion ?Heart:  Regular rate and rhythm ?Lungs:  Clear to auscultation bilaterally ?Back: No paraspinal tenderness ?Neurological Exam: alert and oriented to person, place, and time.  Speech fluent and not dysarthric, language intact.  CN II-XII intact. Bulk and tone normal, muscle strength 5/5 throughout.  Sensation to light touch intact.  Deep tendon reflexes 2+ throughout, toes downgoing.  Finger to nose testing intact.  Gait normal, Romberg negative. ? ? ?Metta Clines, DO ? ? ? ? ? ? ? ?

## 2021-10-14 NOTE — Telephone Encounter (Signed)
Called pt, no answer, LVMTRC. 

## 2021-10-14 NOTE — Telephone Encounter (Signed)
See phone note from 3/23. Did crm help sx? Is she also having hemorrhoid sx now (didn't have them 3/23)?

## 2021-10-15 ENCOUNTER — Ambulatory Visit: Payer: 59 | Admitting: Neurology

## 2021-10-15 ENCOUNTER — Encounter: Payer: Self-pay | Admitting: Neurology

## 2021-10-15 VITALS — BP 122/78 | HR 57 | Ht 64.0 in | Wt 149.0 lb

## 2021-10-15 DIAGNOSIS — G43009 Migraine without aura, not intractable, without status migrainosus: Secondary | ICD-10-CM | POA: Diagnosis not present

## 2021-10-15 MED ORDER — EMGALITY 120 MG/ML ~~LOC~~ SOAJ: 120.0000 mg | SUBCUTANEOUS | 5 refills | Status: DC

## 2021-10-15 MED ORDER — UBRELVY 100 MG PO TABS: 1.0000 | ORAL_TABLET | ORAL | 5 refills | Status: DC | PRN

## 2021-10-15 NOTE — Patient Instructions (Signed)
Emgality every 28 days ?[name] as needed ?Follow up one year ?

## 2021-10-21 ENCOUNTER — Other Ambulatory Visit: Payer: Self-pay | Admitting: Obstetrics and Gynecology

## 2021-10-21 MED ORDER — HYDROCORTISONE (PERIANAL) 2.5 % EX CREA: 1.0000 "application " | TOPICAL_CREAM | Freq: Two times a day (BID) | CUTANEOUS | 1 refills | Status: DC

## 2021-10-21 NOTE — Telephone Encounter (Signed)
Yes, hemorrhoid crm eRxd. F/u if vag sx persist.

## 2021-10-21 NOTE — Progress Notes (Signed)
Rx hemorrhoid crm RF eRxd for sx.  ?

## 2021-10-21 NOTE — Telephone Encounter (Signed)
If lotrisone crm I gave her 3/23 didn't help itching in the front, then she needs appt.

## 2021-10-21 NOTE — Telephone Encounter (Signed)
Pt aware. Says she cannot come in for an appt any time soon due to new job she started. She wants to know if hemorrhoid Rx will be sent in? She says she always gets it sent in. ?

## 2021-10-22 ENCOUNTER — Ambulatory Visit: Payer: 59 | Admitting: Podiatry

## 2021-10-22 DIAGNOSIS — M722 Plantar fascial fibromatosis: Secondary | ICD-10-CM | POA: Diagnosis not present

## 2021-10-22 MED ORDER — DICLOFENAC SODIUM 75 MG PO TBEC: 75.0000 mg | DELAYED_RELEASE_TABLET | Freq: Two times a day (BID) | ORAL | 1 refills | Status: DC

## 2021-10-22 NOTE — Telephone Encounter (Signed)
Called pt, no answer, left detailed msg Rx has been sent to pharmacy. 

## 2021-10-24 ENCOUNTER — Ambulatory Visit: Payer: 59 | Admitting: Neurology

## 2021-10-27 ENCOUNTER — Other Ambulatory Visit: Payer: Self-pay | Admitting: Neurology

## 2021-10-28 ENCOUNTER — Telehealth: Payer: Self-pay

## 2021-10-28 ENCOUNTER — Telehealth: Payer: Self-pay | Admitting: Podiatry

## 2021-10-28 MED ORDER — UBRELVY 100 MG PO TABS: 1.0000 | ORAL_TABLET | ORAL | 5 refills | Status: DC | PRN

## 2021-10-28 NOTE — Telephone Encounter (Signed)
Telephone call from patient, Per patient she was advised her Emgality and Hinda Kehr was approved by the office. ? ?After reviewing patient chart Per last ov notes medication sent to the [facility] in [place]. ? ?Verified with the patient if this is the correct pharmacy. Per patient [facility] is correct. ? ?[facility] to [facility] 10/15/21. ? ?Per Rep the Emgality was received and ready for pick up.  Need Urbelvy script never received on 10/15/21. Urbelvy added while on the phone with [facility].  ? ?Renee Wheeler reorder. LMOVM for patient ?

## 2021-10-28 NOTE — Telephone Encounter (Signed)
Not sure what size shoe she is.  Could we please call her back and find out the shoe size and if we have them available in stock, [place] office.  If so she can come into the front desk and pick some up.

## 2021-10-28 NOTE — Telephone Encounter (Signed)
Patient was seen in office on 10/22/21 and the patient said she was told someone was going to call her when we had the blue inserts back in stock?  Do we have them in stock? Please advise

## 2021-10-29 NOTE — Telephone Encounter (Signed)
LMOM to call back with shoe size.

## 2021-10-31 NOTE — Telephone Encounter (Signed)
Left message for pt that they do have her size of the powersteps in the [place] office and she can stop in to pick them up.  ?

## 2021-11-03 DIAGNOSIS — M722 Plantar fascial fibromatosis: Secondary | ICD-10-CM | POA: Diagnosis not present

## 2021-11-03 MED ORDER — BETAMETHASONE SOD PHOS & ACET 6 (3-3) MG/ML IJ SUSP
3.0000 mg | Freq: Once | INTRAMUSCULAR | Status: AC
  Administered 2021-11-03: 3 mg via INTRA_ARTICULAR

## 2021-11-03 NOTE — Progress Notes (Addendum)
? ?  Subjective: ?56 y.o. female presenting today for pain to the right heel.  She does have history of follow-up evaluation of bilateral heel pain.  Patient was last seen in the office 04/04/2021.  She states that the pain never really went away.  She did lose her insurance and was unable to follow-up until now.  She also states that she has a new concern for for possible ingrown toenail to the left hallux nail plate.  She presents for further treatment and evaluation ? ? ?Past Medical History:  ?Diagnosis Date  ? Kidney stones   ? ?Past Surgical History:  ?Procedure Laterality Date  ? AUGMENTATION MAMMAPLASTY Bilateral 2000  ? HIATAL HERNIA REPAIR    ? ?Allergies  ?Allergen Reactions  ? Latex Rash  ? Penicillins Rash  ? ? ? ?Objective: ?Physical Exam ?General: The patient is alert and oriented x3 in no acute distress. ? ?Dermatology: Skin is warm, dry and supple bilateral lower extremities. Negative for open lesions or macerations bilateral.  There is an elongated toenail to the left hallux nail plate with associated subungual debris and associated tenderness to palpation.Toenail does not appear to be ingrown however I do believe the patient's symptoms are coming from the subungual debris underlying the nail plate ? ?Vascular: Dorsalis Pedis and Posterior Tibial pulses palpable bilateral.  Capillary fill time is immediate to all digits. ? ?Neurological: Epicritic and protective threshold intact bilateral.  ? ?Musculoskeletal: Tenderness to palpation to the plantar aspect of the right heel along the plantar fascia. All other joints range of motion within normal limits bilateral. Strength 5/5 in all groups bilateral.  ? ?MRI RT HEEL WO CONTRAST 04/25/2021 ?IMPRESSION: ?Mild plantar fasciitis of the proximal central cord at the calcaneal ?insertion. Mild tenosynovitis of the inframalleolar posterior tibial tendon. Mild reactive bony edema within the lateral calcaneal body. Intact ankle ligaments. ? ?Assessment: ?1.  Plantar fasciitis B/L. RT > LT ?2. Subungual debris left hallux nail plate medial border ? ?Plan of Care:  ?1. Patient evaluated. Xrays reviewed.   ?2. Injection of 0.5cc Celestone soluspan injected into the right plantar fascia  ?3.  Prescription for diclofenac 75 mg 2 times daily ?4.  Plantar fascial braces were dispensed bilateral  ?5.  OTC power step insoles dispensed at checkout ?6.  Mechanical debridement of the nail plate to the left hallux Was performed and the subungual debris was removed.  Patient felt really relief after debridement. ?7. return to clinic as needed ? ?Edrick Kins, DPM ?[place] ? ?Dr. Edrick Kins, DPM  ?  ?[address]                                        ?[place], [place] [address]                ?Office [phone]  ?Fax [phone] ? ? ? ? ?

## 2022-03-17 ENCOUNTER — Other Ambulatory Visit: Payer: Self-pay | Admitting: Neurology

## 2022-03-17 ENCOUNTER — Telehealth: Payer: Self-pay | Admitting: Neurology

## 2022-03-17 MED ORDER — UBRELVY 100 MG PO TABS: 1.0000 | ORAL_TABLET | ORAL | 5 refills | Status: DC | PRN

## 2022-03-17 MED ORDER — EMGALITY 120 MG/ML ~~LOC~~ SOAJ
120.0000 mg | SUBCUTANEOUS | 5 refills | Status: DC
Start: 2022-03-17 — End: 2022-09-04

## 2022-03-17 NOTE — Telephone Encounter (Signed)
Refills for Emgality and Renee Wheeler have been sent to [facility]

## 2022-03-17 NOTE — Telephone Encounter (Signed)
1. Which medications need refilled? (List name and dosage, if known) Emgality   2. Which pharmacy/location is medication to be sent to? (include street and city if local pharmacy) [facility] on file  Patient stated it was denied for some reason but not sure why.  Patient is needing [name] as well.

## 2022-03-20 ENCOUNTER — Telehealth: Payer: Self-pay | Admitting: *Deleted

## 2022-03-20 MED ORDER — UBRELVY 100 MG PO TABS
1.0000 | ORAL_TABLET | ORAL | 5 refills | Status: DC | PRN
Start: 2022-03-20 — End: 2022-10-21

## 2022-03-20 NOTE — Telephone Encounter (Signed)
Patient does not have a follow-up appointment.  Go ahead and just schedule the regular follow-up appointment next available.  Thanks, Dr. Amalia Hailey

## 2022-03-20 NOTE — Telephone Encounter (Signed)
Patient called and said she has refills on [name] already.

## 2022-03-20 NOTE — Telephone Encounter (Signed)
Pt called Renee Wheeler [facility], she as refills on file with my script. [facility] called and prescription for ubrelvy canceled.  Pt emgality goes to [facility]

## 2022-03-20 NOTE — Telephone Encounter (Signed)
Patient is requesting something for pain,until she is able to have surgery, has not been seen since 04/23, needs a -f/u appointment. Returned the call back to patient, no answer , left voice message to call back to schedule.

## 2022-03-23 NOTE — Telephone Encounter (Signed)
Please schedule

## 2022-04-03 ENCOUNTER — Other Ambulatory Visit: Payer: Self-pay | Admitting: Podiatry

## 2022-04-03 ENCOUNTER — Telehealth: Payer: Self-pay | Admitting: Podiatry

## 2022-04-03 MED ORDER — METHYLPREDNISOLONE 4 MG PO TBPK
ORAL_TABLET | ORAL | 0 refills | Status: DC
Start: 2022-04-03 — End: 2023-02-02

## 2022-04-03 NOTE — Telephone Encounter (Signed)
Sent in a prescription for a Medrol Dosepak.  Tried to call the patient but she did not answer.  Went to [facility].  She can also take Tylenol intermittently between the diclofenac after completion of the Dosepak.  Thanks, Dr. Amalia Hailey

## 2022-04-03 NOTE — Telephone Encounter (Signed)
Pt states that she is in pain and possible need an injection. She is scheduled for medication refill on 10/16//23. Pt wants to be seen in [place], currently no openings on next Tues unless we get a cancellation. She is on the list for an opening. Pt wants to know what else she can do for the pain.  Please advise.

## 2022-04-04 IMAGING — MR MR HEEL *R* W/O CM
5 series · 38 of 40 positions shown · non-contrast
Comparison: Right foot radiograph 02/28/2021

CLINICAL DATA: Foot pain, chronic, tendinopathy suspected, xray
done, heel pain

EXAM:
MR OF THE RIGHT HEEL WITHOUT CONTRAST
TECHNIQUE: Multiplanar, multisequence MR imaging of the right heel was
performed. No intravenous contrast was administered.

[Series 4: T2 fat-sat · axial · 3.0mm · 0.50mm/px · z∈[-78,+43]mm · 9 of 32 slices shown (1 of 2)]
[im 1/32]
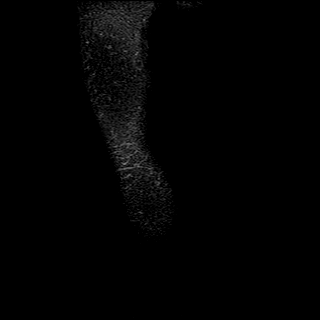
[im 4/32]
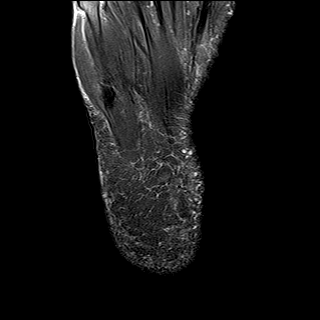
[im 8/32]
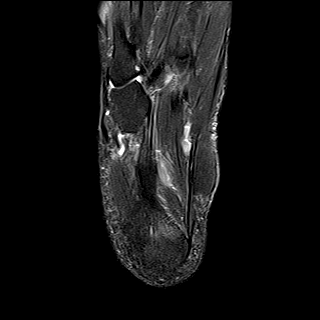
[im 12/32]
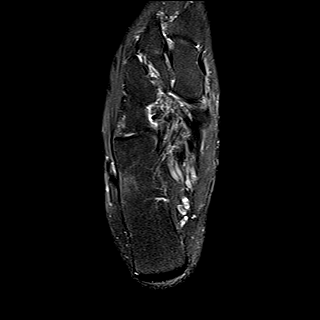
[im 16/32]
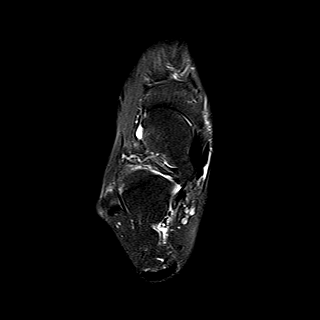
[im 20/32]
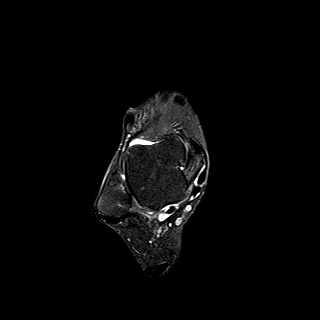
[im 24/32]
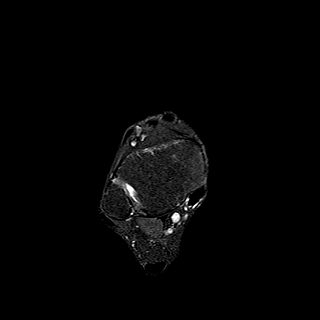
[im 28/32]
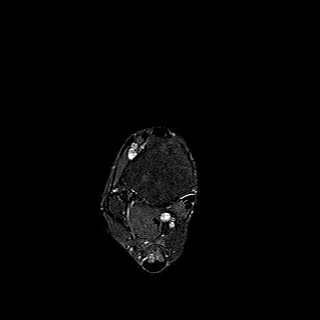
[im 32/32]
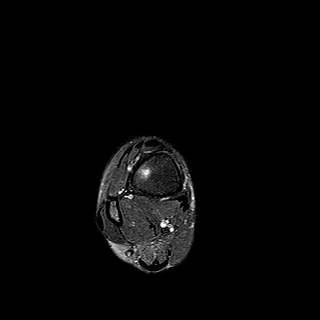

[Series 5: PD fat-sat · axial · 3.0mm · 0.50mm/px · z∈[-78,+43]mm · 9 of 32 slices shown]
[im 1/32]
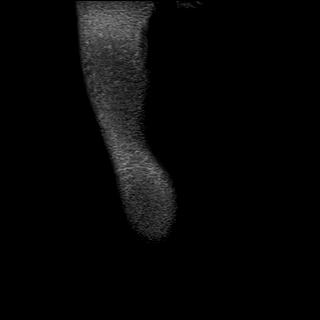
[im 4/32]
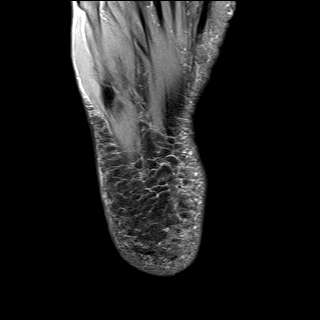
[im 8/32]
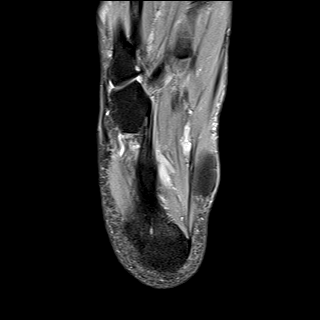
[im 12/32]
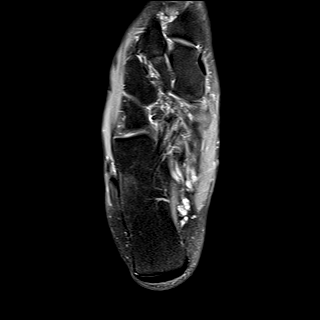
[im 16/32]
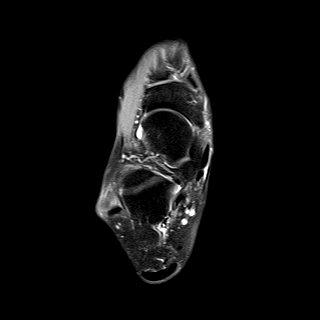
[im 20/32]
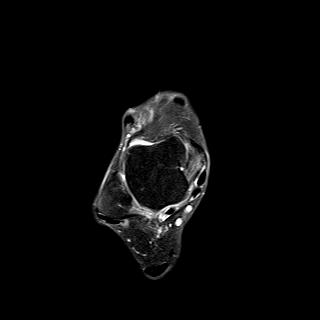
[im 24/32]
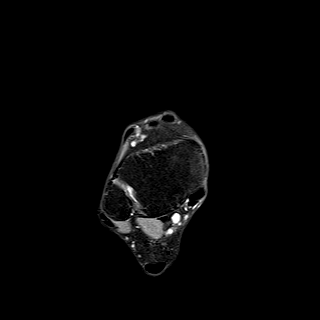
[im 28/32]
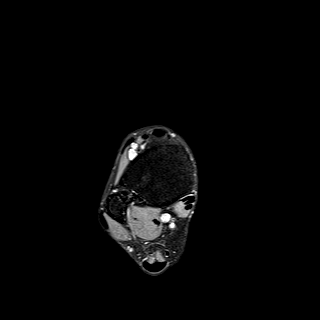
[im 32/32]
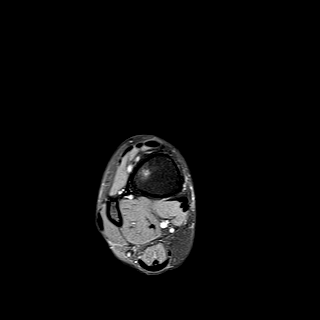

[Series 6: T1 · sagittal · 4.0mm · 0.56mm/px · 6 of 22 slices shown]
[im 1/22]
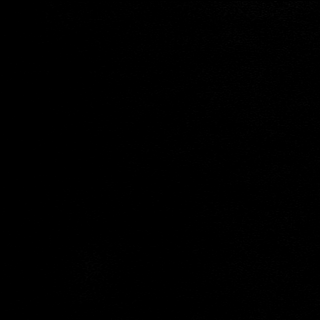
[im 5/22]
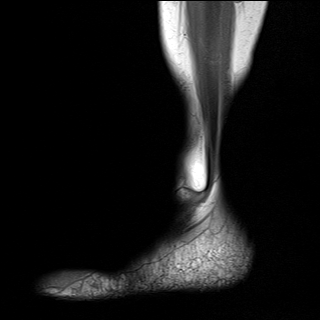
[im 9/22]
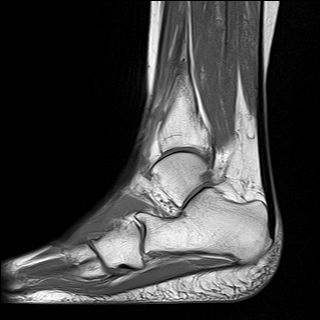
[im 13/22]
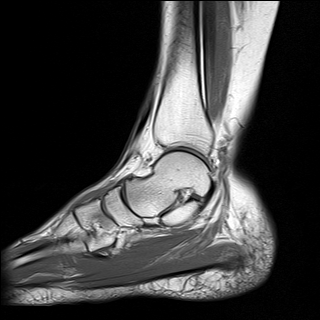
[im 17/22]
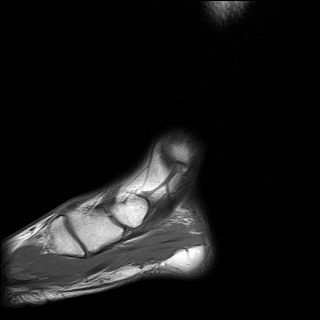
[im 22/22]
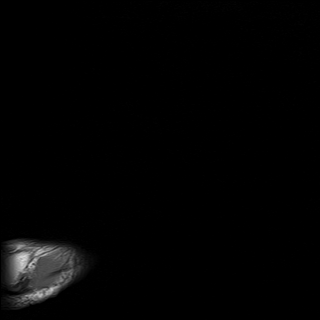

[Series 7: STIR · sagittal · 4.0mm · 0.35mm/px · 6 of 22 slices shown]
[im 1/22]
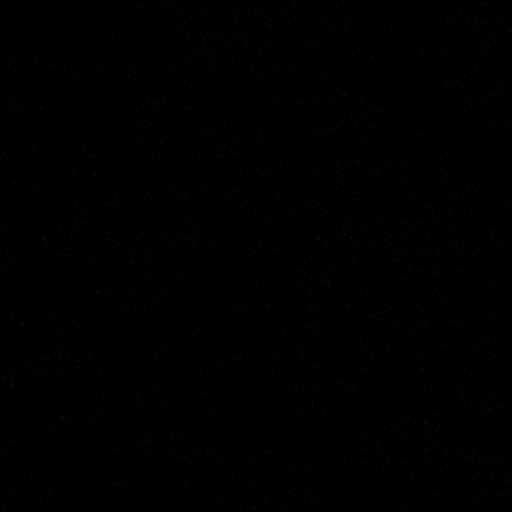
[im 5/22]
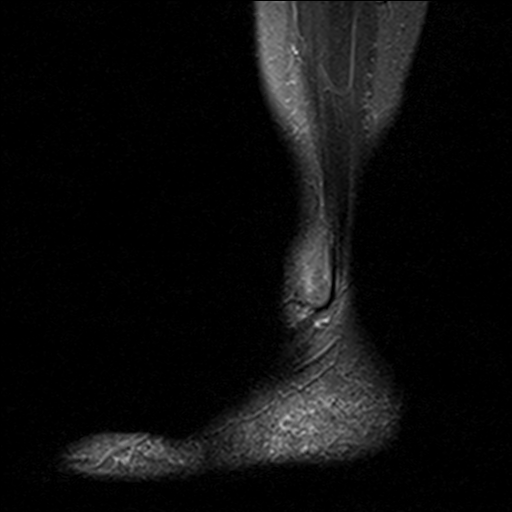
[im 9/22]
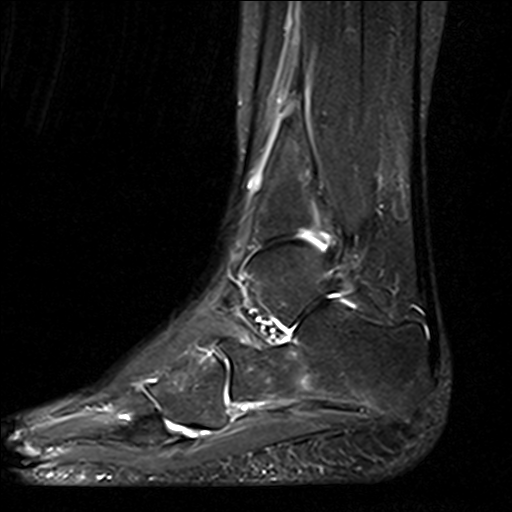
[im 13/22]
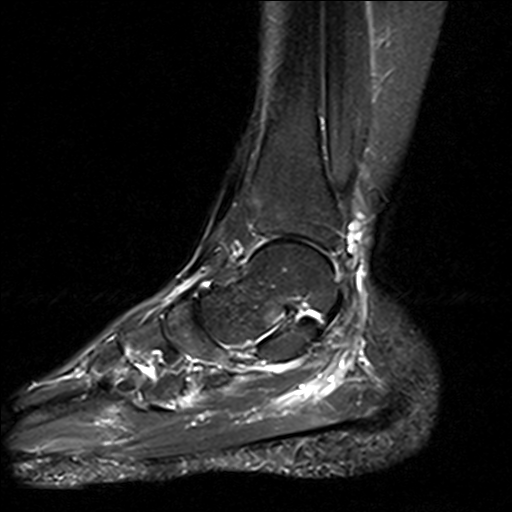
[im 17/22]
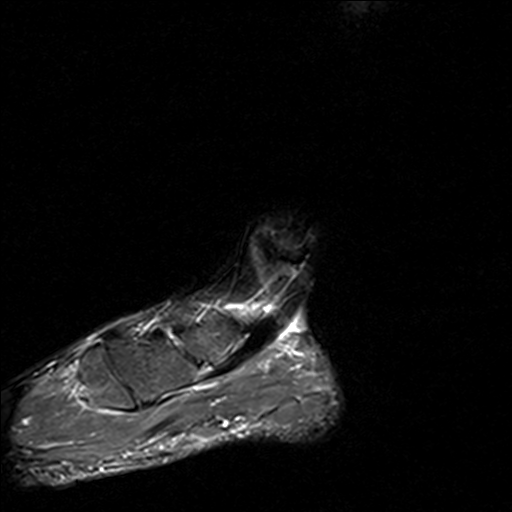
[im 22/22]
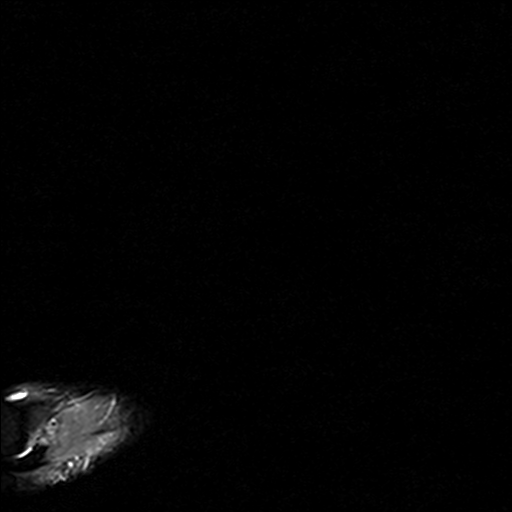

[Series 8: T2 fat-sat · coronal · 3.0mm · 0.50mm/px · 8 of 35 slices shown (2 of 2)]
[im 1/35]
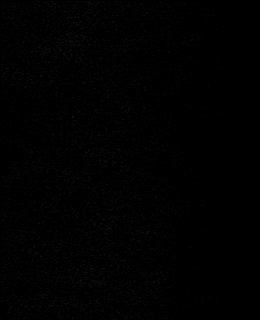
[im 4/35]
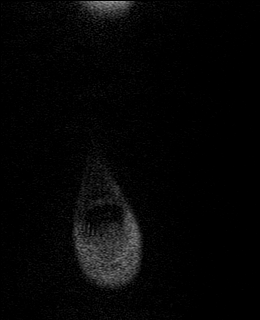
[im 12/35]
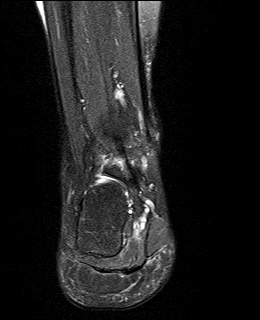
[im 16/35]
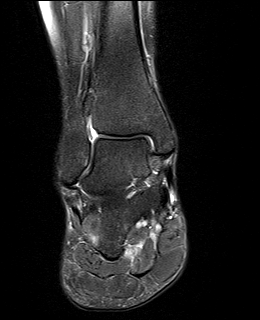
[im 19/35]
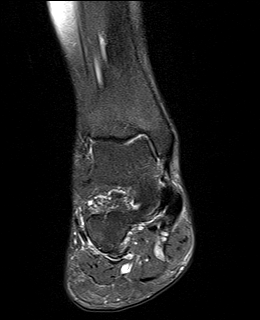
[im 23/35]
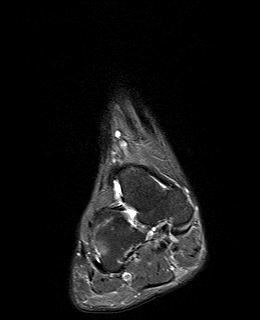
[im 31/35]
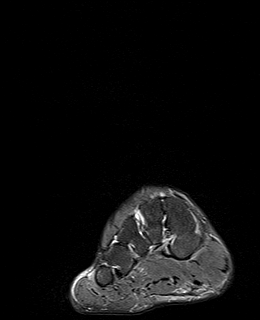
[im 35/35]
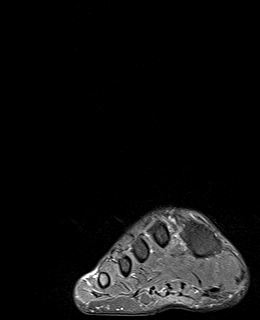

[38 of 40 positions shown; findings below may reference images not displayed]

FINDINGS: Bones/Joint/Cartilage

There is very mild marrow edema signal at the plantar aspect of the
calcaneus plantar fascia insertion. There is also mild focally
marrow edema along the lateral aspect of the calcaneal body, likely
reactive from adjacent crossing of the peroneal tendons (axial T2
image 22).

Ligaments

Intact talofibular and calcaneofibular ligaments. Intact
tibiofibular ligaments. Intact deltoid ligament and spring ligament.

Muscles and Tendons

No significant muscle atrophy or edema. There is thickening of the
central cord plantar fascia with adjacent edema signal at the
plantar fascia insertion on the calcaneus. There is mild
tenosynovitis of the posterior tibial tendon below the medial
malleolus. The other flexor tendons are unremarkable. The peroneal
tendons are unremarkable. The extensor tendons are unremarkable. The
Achilles tendon is intact and unremarkable.

Soft tissues

No focal fluid collection.  The tarsal tunnel is unremarkable.
IMPRESSION: Mild plantar fasciitis of the proximal central cord at the calcaneal
insertion.

Mild tenosynovitis of the inframalleolar posterior tibial tendon.

Mild reactive bony edema within the lateral calcaneal body.

Intact ankle ligaments.

## 2022-04-06 NOTE — Telephone Encounter (Signed)
Called patient as well, no answer, left message that medication has been sent/ his recommendations.

## 2022-04-13 ENCOUNTER — Ambulatory Visit: Payer: 59 | Admitting: Podiatry

## 2022-04-22 ENCOUNTER — Ambulatory Visit: Payer: 59 | Admitting: Dermatology

## 2022-07-17 ENCOUNTER — Other Ambulatory Visit: Payer: Self-pay

## 2022-07-17 DIAGNOSIS — L290 Pruritus ani: Secondary | ICD-10-CM

## 2022-07-17 MED ORDER — HYDROCORTISONE (PERIANAL) 2.5 % EX CREA: 1.0000 | TOPICAL_CREAM | Freq: Two times a day (BID) | CUTANEOUS | 0 refills | Status: AC

## 2022-07-30 DIAGNOSIS — Z1371 Encounter for nonprocreative screening for genetic disease carrier status: Secondary | ICD-10-CM

## 2022-07-30 DIAGNOSIS — Z803 Family history of malignant neoplasm of breast: Secondary | ICD-10-CM

## 2022-07-30 HISTORY — DX: Encounter for nonprocreative screening for genetic disease carrier status: Z13.71

## 2022-07-30 HISTORY — DX: Family history of malignant neoplasm of breast: Z80.3

## 2022-08-19 NOTE — Progress Notes (Unsigned)
PCP: Renee Wheeler, No Pcp Per   No chief complaint on file.   HPI:      Renee Wheeler is a 57 y.o. G2P1001 whose LMP was No LMP recorded. Renee Wheeler is perimenopausal., presents today for her annual examination.  Her menses are absent due to menopause, no PMB. She does have vasomotor sx; didn't like how she felt on HRT.   Sex activity: not sexually active. She does not have vaginal dryness.  Last Pap: 07/26/20 Results were: no abnormalities /neg HPV DNA.  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   Had recent genital outbreak last wk, taking valtrex. Has had urinary frequency and urgency, with and without good flow, as well as dysuria/pressure. No hematuria, LBP, fevers, no vag sx except itch with HSV lesions. Hx of UTIs in past.   Last mammogram: 08/30/20  Results were: normal--routine follow-up in 12 months There is a FH of breast cancer in her mother, genetic testing not indicated. There is no FH of ovarian cancer. The Renee Wheeler does do self-breast exams.  Colonoscopy: never; did referral a few yrs ago but never did test  Tobacco use: The Renee Wheeler denies current or previous tobacco use. Alcohol use: none No drug use Exercise: not active  She does get adequate calcium but not Vitamin D in her diet. Hx of Vit D deficiency.  Labs with PCP.   Renee Wheeler Active Problem List   Diagnosis Date Noted   Bacterial vaginosis 12/27/2018   Migraine 12/27/2018   HSV (herpes simplex virus) anogenital infection 06/10/2018   Hypothyroidism 06/10/2018   Small bowel obstruction ([place]) 09/24/2010    Past Surgical History:  Procedure Laterality Date   AUGMENTATION MAMMAPLASTY Bilateral 2000   HIATAL HERNIA REPAIR      Family History  Problem Relation Age of Onset   Skin cancer Mother    Breast cancer Mother 44   Thyroid disease Mother    Skin cancer Father    Stomach cancer Maternal Grandmother     Social History   Socioeconomic History   Marital status:  Married    Spouse name: Not on file   Number of children: Not on file   Years of education: Not on file   Highest education level: Not on file  Occupational History   Not on file  Tobacco Use   Smoking status: Never   Smokeless tobacco: Never  Vaping Use   Vaping Use: Never used  Substance and Sexual Activity   Alcohol use: Yes    Comment: Occ   Drug use: Never   Sexual activity: Not Currently    Birth control/protection: None, Post-menopausal  Other Topics Concern   Not on file  Social History Narrative   Right handed   Live with husband one story   Social Determinants of Health   Financial Resource Strain: Not on file  Food Insecurity: Not on file  Transportation Needs: Not on file  Physical Activity: Not on file  Stress: Not on file  Social Connections: Not on file  Intimate Partner Violence: Not on file     Current Outpatient Medications:    busPIRone (BUSPAR) 5 MG tablet, Take 5 mg by mouth daily., Disp: , Rfl:    clotrimazole-betamethasone (LOTRISONE) cream, Apply externally BID prn sx up to 2 wks, Disp: 15 g, Rfl: 0   diclofenac (VOLTAREN) 75 MG EC tablet, Take 1 tablet (75 mg total) by mouth 2 (two) times daily., Disp: 60 tablet, Rfl: 1  escitalopram (LEXAPRO) 20 MG tablet, Take 20 mg by mouth daily., Disp: , Rfl:    Galcanezumab-gnlm (EMGALITY) 120 MG/ML SOAJ, Inject 120 mg into the skin every 28 (twenty-eight) days., Disp: 1.12 mL, Rfl: 5   hydrocortisone (PROCTOZONE-HC) 2.5 % rectal cream, Place 1 Application rectally 2 (two) times daily., Disp: 30 g, Rfl: 0   hydrocortisone 2.5 % cream, APP TO FACE BID FOR 5 TO 7 DAYS (Renee Wheeler not taking: Reported on 10/15/2021), Disp: , Rfl:    hydrocortisone 2.5 % cream, Place rectally. (Renee Wheeler not taking: Reported on 10/15/2021), Disp: , Rfl:    hydrOXYzine (ATARAX/VISTARIL) 25 MG tablet, Take 1 tablet (25 mg total) by mouth every 6 (six) hours as needed for anxiety., Disp: 30 tablet, Rfl: 2   meloxicam (MOBIC) 15 MG  tablet, TAKE 1 TABLET(15 MG) BY MOUTH DAILY (Renee Wheeler not taking: Reported on 10/15/2021), Disp: 30 tablet, Rfl: 1   methylPREDNISolone (MEDROL DOSEPAK) 4 MG TBPK tablet, 6 day dose pack - take as directed, Disp: 21 tablet, Rfl: 0   PRED FORTE 1 % ophthalmic suspension, Place 1 drop into the left eye 3 (three) times daily. (Renee Wheeler not taking: Reported on 10/15/2021), Disp: , Rfl:    predniSONE (DELTASONE) 20 MG tablet, Take 20 mg by mouth daily. (Renee Wheeler not taking: Reported on 10/15/2021), Disp: , Rfl:    RHOFADE 1 % CREA, , Disp: , Rfl:    Ubrogepant ([name]) 100 MG TABS, Take 1 tablet by mouth as needed (May repeat after 2 hours.  Maximum 2 tablets in 24 hours)., Disp: 10 tablet, Rfl: 5     ROS:  Review of Systems  Constitutional:  Negative for fatigue, fever and unexpected weight change.  Respiratory:  Negative for cough, shortness of breath and wheezing.   Cardiovascular:  Negative for chest pain, palpitations and leg swelling.  Gastrointestinal:  Negative for blood in stool, constipation, diarrhea, nausea and vomiting.  Endocrine: Negative for cold intolerance, heat intolerance and polyuria.  Genitourinary:  Positive for dysuria, frequency and urgency. Negative for dyspareunia, flank pain, genital sores, hematuria, menstrual problem, pelvic pain, vaginal bleeding, vaginal discharge and vaginal pain.  Musculoskeletal:  Negative for back pain, joint swelling and myalgias.  Skin:  Negative for rash.  Neurological:  Negative for dizziness, syncope, light-headedness, numbness and headaches.  Hematological:  Negative for adenopathy.  Psychiatric/Behavioral:  Positive for agitation and dysphoric mood. Negative for confusion, sleep disturbance and suicidal ideas. The Renee Wheeler is not nervous/anxious.    BREAST: No symptoms    Objective: There were no vitals taken for this visit.   Physical Exam Constitutional:      Appearance: She is well-developed.  Genitourinary:     Vulva normal.      Right Labia: No rash, tenderness or lesions.    Left Labia: No tenderness, lesions or rash.    No vaginal discharge, erythema or tenderness.      Right Adnexa: not tender and no mass present.    Left Adnexa: not tender and no mass present.    No cervical friability or polyp.     Uterus is not enlarged or tender.  Breasts:    Right: No mass, nipple discharge, skin change or tenderness.     Left: No mass, nipple discharge, skin change or tenderness.  Neck:     Thyroid: No thyromegaly.  Cardiovascular:     Rate and Rhythm: Normal rate and regular rhythm.     Heart sounds: Normal heart sounds. No murmur heard. Pulmonary:  Effort: Pulmonary effort is normal.     Breath sounds: Normal breath sounds.  Abdominal:     Palpations: Abdomen is soft.     Tenderness: There is no abdominal tenderness. There is no guarding or rebound.  Musculoskeletal:        General: Normal range of motion.     Cervical back: Normal range of motion.  Lymphadenopathy:     Cervical: No cervical adenopathy.  Neurological:     General: No focal deficit present.     Mental Status: She is alert and oriented to person, place, and time.     Cranial Nerves: No cranial nerve deficit.  Skin:    General: Skin is warm and dry.  Psychiatric:        Mood and Affect: Mood normal.        Behavior: Behavior normal.        Thought Content: Thought content normal.        Judgment: Judgment normal.  Vitals reviewed.     Results: No results found for this or any previous visit (from the past 24 hour(s)).   Assessment/Plan:  Encounter for annual routine gynecological examination  Encounter for screening mammogram for malignant neoplasm of breast - Plan: MM 3D SCREEN BREAST BILATERAL; pt to [facility]  Screening for colon cancer - Plan: Ambulatory referral to Gastroenterology; refer to GI due to age  UTI symptoms - Plan: POCT Urinalysis Dipstick, Urine Culture, nitrofurantoin, macrocrystal-monohydrate,  (MACROBID) 100 MG capsule; pos sx and UA. Check C&S. Rx macrobid. F/u prn.   Herpes simplex vulvovaginitis - Plan: valACYclovir (VALTREX) 500 MG tablet; Rx RF episodically.    No orders of the defined types were placed in this encounter.           GYN counsel breast self exam, mammography screening, menopause, adequate intake of calcium and vitamin D, diet and exercise    F/U  No follow-ups on file.  Keo Schirmer B. Ysela Hettinger, PA-C 08/19/2022 9:46 PM

## 2022-08-20 ENCOUNTER — Encounter: Payer: Self-pay | Admitting: Obstetrics and Gynecology

## 2022-08-20 ENCOUNTER — Ambulatory Visit (INDEPENDENT_AMBULATORY_CARE_PROVIDER_SITE_OTHER): Payer: 59 | Admitting: Obstetrics and Gynecology

## 2022-08-20 VITALS — BP 120/80 | Ht 64.0 in | Wt 146.0 lb

## 2022-08-20 DIAGNOSIS — Z0000 Encounter for general adult medical examination without abnormal findings: Secondary | ICD-10-CM

## 2022-08-20 DIAGNOSIS — Z1321 Encounter for screening for nutritional disorder: Secondary | ICD-10-CM

## 2022-08-20 DIAGNOSIS — Z13220 Encounter for screening for lipoid disorders: Secondary | ICD-10-CM

## 2022-08-20 DIAGNOSIS — K649 Unspecified hemorrhoids: Secondary | ICD-10-CM | POA: Insufficient documentation

## 2022-08-20 DIAGNOSIS — A6004 Herpesviral vulvovaginitis: Secondary | ICD-10-CM

## 2022-08-20 DIAGNOSIS — K5904 Chronic idiopathic constipation: Secondary | ICD-10-CM | POA: Insufficient documentation

## 2022-08-20 DIAGNOSIS — Z01419 Encounter for gynecological examination (general) (routine) without abnormal findings: Secondary | ICD-10-CM

## 2022-08-20 DIAGNOSIS — R7303 Prediabetes: Secondary | ICD-10-CM

## 2022-08-20 DIAGNOSIS — Z1329 Encounter for screening for other suspected endocrine disorder: Secondary | ICD-10-CM

## 2022-08-20 DIAGNOSIS — E559 Vitamin D deficiency, unspecified: Secondary | ICD-10-CM | POA: Insufficient documentation

## 2022-08-20 DIAGNOSIS — Z803 Family history of malignant neoplasm of breast: Secondary | ICD-10-CM

## 2022-08-20 DIAGNOSIS — Z1231 Encounter for screening mammogram for malignant neoplasm of breast: Secondary | ICD-10-CM

## 2022-08-20 DIAGNOSIS — Z1211 Encounter for screening for malignant neoplasm of colon: Secondary | ICD-10-CM

## 2022-08-20 MED ORDER — VALACYCLOVIR HCL 500 MG PO TABS: 500.0000 mg | ORAL_TABLET | Freq: Every day | ORAL | 3 refills | Status: DC

## 2022-08-20 MED ORDER — VALACYCLOVIR HCL 1 G PO TABS: 500.0000 mg | ORAL_TABLET | Freq: Every day | ORAL | 3 refills | Status: DC

## 2022-08-20 NOTE — Patient Instructions (Signed)
I value your feedback and you entrusting us with your care. If you get a [facility] patient survey, I would appreciate you taking the time to let us know about your experience today. Thank you!  [facility]: [phone]      

## 2022-08-21 LAB — COMPREHENSIVE METABOLIC PANEL
ALT: 7 IU/L (ref 0–32)
AST: 10 IU/L (ref 0–40)
Albumin/Globulin Ratio: 2.5 — ABNORMAL HIGH (ref 1.2–2.2)
Albumin: 4.7 g/dL (ref 3.8–4.9)
Alkaline Phosphatase: 64 IU/L (ref 44–121)
BUN/Creatinine Ratio: 29 — ABNORMAL HIGH (ref 9–23)
BUN: 16 mg/dL (ref 6–24)
Bilirubin Total: 0.4 mg/dL (ref 0.0–1.2)
CO2: 24 mmol/L (ref 20–29)
Calcium: 9.7 mg/dL (ref 8.7–10.2)
Chloride: 104 mmol/L (ref 96–106)
Creatinine, Ser: 0.56 mg/dL — ABNORMAL LOW (ref 0.57–1.00)
Globulin, Total: 1.9 g/dL (ref 1.5–4.5)
Glucose: 73 mg/dL (ref 70–99)
Potassium: 3.8 mmol/L (ref 3.5–5.2)
Sodium: 143 mmol/L (ref 134–144)
Total Protein: 6.6 g/dL (ref 6.0–8.5)
eGFR: 108 mL/min/{1.73_m2} (ref >59)

## 2022-08-21 LAB — HEMOGLOBIN A1C
Est. average glucose Bld gHb Est-mCnc: 114 mg/dL
Hgb A1c MFr Bld: 5.6 % (ref 4.8–5.6)

## 2022-08-21 LAB — TSH+FREE T4
Free T4: 1.06 ng/dL (ref 0.82–1.77)
TSH: 3.74 u[IU]/mL (ref 0.450–4.500)

## 2022-08-21 LAB — VITAMIN D 25 HYDROXY (VIT D DEFICIENCY, FRACTURES): Vit D, 25-Hydroxy: 19.5 ng/mL — ABNORMAL LOW (ref 30.0–100.0)

## 2022-08-26 NOTE — Telephone Encounter (Signed)
Pt calling wanting to know what to do about her vitamin D diff and hasn't heard back from referral.  [phone]  Adv pt per [facility] to take vitamin D3 5,000 IU daily; adv rest of labs are negative; as for referral pt states she called [facility] but they cannot see her until May; pt would like to have it done sooner than may.

## 2022-09-03 ENCOUNTER — Other Ambulatory Visit: Payer: Self-pay | Admitting: Neurology

## 2022-09-03 ENCOUNTER — Encounter: Payer: Self-pay | Admitting: Obstetrics and Gynecology

## 2022-09-04 ENCOUNTER — Telehealth: Payer: Self-pay | Admitting: Obstetrics and Gynecology

## 2022-09-04 NOTE — Telephone Encounter (Signed)
Patient called inquiring about multiple calls that had came from our office. Patient seemed highly irritated during the call at the constant calls from our office. I have relayed the information pertaining to the calls to the patient. Informed patient that the calls were related to her labs results which had went unread by her(patient) and we wanted to ensure that you were aware of the results and the next steps.   This message was read aloud to patient  " You are still Vitamin D deficient. Please start Vitamin D3 5,000 IU daily--it's over-the-counter. Your other labs are normal. No pre-diabetes, normal thyroid. Have a nice day. Alicia  Patient voiced understanding. Patient also mentioned her referral to GI and being scheduled out into May. Patient would like to be seen sooner. Inquiring about next week. I offered to re route the referral to next closest location being [facility]. Patient declined stating that is too far. Patient states she has been added to the wait list at [facility].

## 2022-09-10 ENCOUNTER — Telehealth: Payer: Self-pay | Admitting: Obstetrics and Gynecology

## 2022-09-10 NOTE — Telephone Encounter (Signed)
Pt aware of neg MyRisk results. IBIS=16.6%/riskscore=9.3%. No extra screening needed. Pt has mammo appt 3/24.   Patient understands these results only apply to her and her children, and this is not indicative of genetic testing results of her other family members. It is recommended that her other family members have genetic testing done.  Pt also understands negative genetic testing doesn't mean she will never get any of these cancers.   Hard copy mailed to pt. F/u prn.

## 2022-09-23 ENCOUNTER — Ambulatory Visit
Admission: RE | Admit: 2022-09-23 | Discharge: 2022-09-23 | Disposition: A | Discharge status: Home / Self Care | Payer: 59 | Source: Ambulatory Visit | Attending: Obstetrics and Gynecology | Admitting: Obstetrics and Gynecology

## 2022-09-23 DIAGNOSIS — Z1231 Encounter for screening mammogram for malignant neoplasm of breast: Secondary | ICD-10-CM | POA: Diagnosis not present

## 2022-10-06 ENCOUNTER — Other Ambulatory Visit: Payer: Self-pay | Admitting: Neurology

## 2022-10-19 NOTE — Progress Notes (Unsigned)
NEUROLOGY FOLLOW UP OFFICE NOTE  Anaiz Qazi [id number]  Assessment/Plan:   Migraine without aura, without status migrainosus, not intractable   Migraine prevention:  Emgality every 28 days *** Migraine rescue:  [name]  *** Limit use of pain relievers to no more than 2 days out of week to prevent risk of rebound or medication-overuse headache. Keep headache diary Follow up 1 year ***     Subjective:  Renee Wheeler is a 57 year old right-handed white female who follows up for migraines.   UPDATE: Intensity:  Severe to moderate Duration:  2 hours without postdrome headache Frequency:  1 in last 30 days Frequency of abortive medication: once Current NSAIDS:  none Current analgesics:  none Current triptans:  sumatriptan Current ergotamine:  none Current anti-emetic:  Zofran ODT  Current muscle relaxants:  none Current anti-anxiolytic:  none Current sleep aide:  none Current Antihypertensive medications:  none Current Antidepressant medications:  none Current Anticonvulsant medications:  none Current anti-CGRP:  Emgality, [name]  Current Vitamins/Herbal/Supplements:  none Current Antihistamines/Decongestants:  none Other therapy:  none Hormone/birth control:  none     Caffeine:  1/2 cup coffee in AM.  Rarely Monster drink Diet:  Not enough water.  Sometimes a lemon soda. Exercise:  Not routine Depression:  yes; Anxiety:  yes Other pain:  no Sleep:  variable   HISTORY:  She has had migraines since she was in her 30s.  They are severe pounding and pressure bifrontal headache associated with nausea, photophobia, phonophobia, osmophobia sometimes tinnitus and blurred vision.  No vomiting, numbness or weakness.  They usually last 2 to 4 days.  They have been occurring 5 days a month.  They have been increased by stress.  No other known triggers.  She went to the ED in April 2020 for migraine with associated dizziness and hearing loss.     Past NSAIDS:   Advil, Toradol shot (ineffective), [name] Past analgesics:  Fioricet; Tylenol, Excedrin; tramadol Past abortive triptans:  Eletriptan ; rizatriptan , sumatriptan tablet Past abortive ergotamine:  none Past muscle relaxants:  none Past anti-emetic:  none Past antihypertensive medications:  propranolol Past antidepressant medications:  Nortriptyline ; escitalopram ; paroxetine Past anticonvulsant medications:  topiramate  Past anti-CGRP:  Nurtec (rescue, helped but [name] more effective) Past vitamins/Herbal/Supplements:  none Past antihistamines/decongestants:  meclizine Other past therapies:  none     Family history of headache:  no  PAST MEDICAL HISTORY: Past Medical History:  Diagnosis Date   BRCA negative 07/2022   MyRisk neg except PALB2 VUS   Family history of breast cancer 07/2022   IBIS=16.6%/riskscore=9.3%   Kidney stones     MEDICATIONS: Current Outpatient Medications on File Prior to Visit  Medication Sig Dispense Refill   busPIRone (BUSPAR) 5 MG tablet Take 5 mg by mouth daily.     cetirizine (ZYRTEC) 10 MG tablet Take 10 mg by mouth daily.     clotrimazole-betamethasone (LOTRISONE) cream Apply externally BID prn sx up to 2 wks (Patient not taking: Reported on 08/20/2022) 15 g 0   diclofenac (VOLTAREN) 75 MG EC tablet Take 1 tablet (75 mg total) by mouth 2 (two) times daily. (Patient not taking: Reported on 08/20/2022) 60 tablet 1   EMGALITY 120 MG/ML SOAJ ADMINISTER 1 ML UNDER THE SKIN EVERY 28 DAYS 1 mL 0   escitalopram (LEXAPRO) 20 MG tablet Take 20 mg by mouth daily.     hydrocortisone (PROCTOZONE-HC) 2.5 % rectal cream Place 1 Application rectally 2 (two) times daily.  30 g 0   hydrocortisone 2.5 % cream      hydrocortisone 2.5 % cream Place rectally.     hydrOXYzine (ATARAX/VISTARIL) 25 MG tablet Take 1 tablet (25 mg total) by mouth every 6 (six) hours as needed for anxiety. 30 tablet 2   meloxicam (MOBIC) 15 MG tablet TAKE 1 TABLET(15 MG) BY  MOUTH DAILY 30 tablet 1   methylPREDNISolone (MEDROL DOSEPAK) 4 MG TBPK tablet 6 day dose pack - take as directed 21 tablet 0   PRED FORTE 1 % ophthalmic suspension Place 1 drop into the left eye 3 (three) times daily.     predniSONE (DELTASONE) 20 MG tablet Take 20 mg by mouth daily.     RHOFADE 1 % CREA      Ubrogepant ([name]) 100 MG TABS Take 1 tablet by mouth as needed (May repeat after 2 hours.  Maximum 2 tablets in 24 hours). 10 tablet 5   valACYclovir (VALTREX) 500 MG tablet Take 1 tablet (500 mg total) by mouth daily. 90 tablet 3   No current facility-administered medications on file prior to visit.    ALLERGIES: Allergies  Allergen Reactions   Latex Rash   Penicillins Rash    FAMILY HISTORY: Family History  Problem Relation Age of Onset   Skin cancer Mother    Breast cancer Mother 41   Thyroid disease Mother    Skin cancer Father    Pancreatic cancer Maternal Grandmother        or stomach      Objective:  *** General: No acute distress.  Patient appears well-groomed.   Head:  Normocephalic/atraumatic Eyes:  Fundi examined but not visualized Neck: supple, no paraspinal tenderness, full range of motion Heart:  Regular rate and rhythm Neurological Exam: alert and oriented to person, place, and time.  Speech fluent and not dysarthric, language intact.  CN II-XII intact. Bulk and tone normal, muscle strength 5/5 throughout.  Sensation to light touch intact.  Deep tendon reflexes 2+ throughout.  Finger to nose testing intact.  Gait normal, Romberg negative.   Shon Millet, DO

## 2022-10-21 ENCOUNTER — Ambulatory Visit: Payer: 59 | Admitting: Neurology

## 2022-10-21 ENCOUNTER — Encounter: Payer: Self-pay | Admitting: Neurology

## 2022-10-21 VITALS — BP 110/65 | HR 85 | Ht 63.0 in | Wt 146.4 lb

## 2022-10-21 DIAGNOSIS — R519 Headache, unspecified: Secondary | ICD-10-CM | POA: Diagnosis not present

## 2022-10-21 DIAGNOSIS — M542 Cervicalgia: Secondary | ICD-10-CM | POA: Diagnosis not present

## 2022-10-21 DIAGNOSIS — G43009 Migraine without aura, not intractable, without status migrainosus: Secondary | ICD-10-CM

## 2022-10-21 MED ORDER — EMGALITY 120 MG/ML ~~LOC~~ SOAJ: SUBCUTANEOUS | 11 refills | Status: DC

## 2022-10-21 MED ORDER — CYCLOBENZAPRINE HCL 10 MG PO TABS: 10.0000 mg | ORAL_TABLET | Freq: Every evening | ORAL | 5 refills | Status: AC | PRN

## 2022-10-21 MED ORDER — UBRELVY 100 MG PO TABS: 1.0000 | ORAL_TABLET | ORAL | 11 refills | Status: DC | PRN

## 2022-10-21 NOTE — Patient Instructions (Signed)
MRI and MRA of brain Emgality [name] Cyclobenzaprine at bedtime for neck pain Follow up 1 year

## 2022-11-20 ENCOUNTER — Other Ambulatory Visit: Payer: Self-pay | Admitting: Neurology

## 2022-11-25 ENCOUNTER — Telehealth: Payer: Self-pay | Admitting: Neurology

## 2022-11-25 NOTE — Telephone Encounter (Signed)
Per patient she was advised that her Emaglity script was sent 10/21/22. We will give the Pharmacy a call to check.   Script given verbally to [employer].    Patient advised.

## 2022-11-25 NOTE — Telephone Encounter (Signed)
Patient states that she was told that she did not have any more refills on the emgality

## 2022-11-26 ENCOUNTER — Ambulatory Visit: Payer: 59 | Admitting: Gastroenterology

## 2022-12-01 ENCOUNTER — Telehealth: Payer: Self-pay | Admitting: Neurology

## 2022-12-01 NOTE — Telephone Encounter (Signed)
New message    Need a coupon for Galcanezumab-gnlm (EMGALITY) 120 MG/ML SOAJ , her out of pocket cost is  $ 200.00 with her insurance   [id number] - [place], [place] - [address]

## 2022-12-01 NOTE — Telephone Encounter (Signed)
Telephone call to patient no answer. LMOVM please go to [email or web address] to apply for discount card online or you may stop by the office to pick up a copay card.

## 2022-12-03 ENCOUNTER — Other Ambulatory Visit: Payer: Self-pay

## 2022-12-03 DIAGNOSIS — A6000 Herpesviral infection of urogenital system, unspecified: Secondary | ICD-10-CM | POA: Insufficient documentation

## 2022-12-03 DIAGNOSIS — N83209 Unspecified ovarian cyst, unspecified side: Secondary | ICD-10-CM | POA: Insufficient documentation

## 2022-12-03 DIAGNOSIS — R5383 Other fatigue: Secondary | ICD-10-CM | POA: Insufficient documentation

## 2022-12-03 DIAGNOSIS — N390 Urinary tract infection, site not specified: Secondary | ICD-10-CM | POA: Insufficient documentation

## 2022-12-03 NOTE — Progress Notes (Signed)
Renee Amy, PA-C [address]  [address]  [place], [place] [address]  Main: [phone]  Fax: [phone]   Gastroenterology Consultation  Referring Provider:     Trenda Moots Primary Care Physician:  Renee Medina, MD Primary Gastroenterologist:  Dr. Midge Wheeler / Renee Amy, PA-C  Reason for Consultation:     Screening colonoscopy, constipation, hemorrhoids        HPI:   Renee Wheeler is a 57 y.o. y/o female referred for consultation & management  by Renee Medina, MD.    Patient has history of irritable bowel syndrome with chronic constipation.  She last saw Dr. Servando Wheeler in our office for this 10/2019.  She was given samples of Linzess to try.  She was recommended to have a screening colonoscopy, however she did not follow through and was lost to follow-up.  She has tried MiraLAX and fiber in the past with little benefit.  She is not currently taking any treatment for constipation.  No previous colonoscopy.  No family history of colon cancer or colon polyps.  Patient states she is having lower abdominal cramping, chronic constipation, episodes of diarrhea, hemorrhoids, and rectal bleeding.  Recently diagnosed with iritis.  Had a positive HLA-B27 lab results.  She was told to follow-up with GI to rule out inflammatory bowel disease.  ANA and rheumatoid factor were negative.  CBC normal with hemoglobin 13.6.  Negative QuantiFERON-TB gold test.  Labs were done 10/17/2022.    Past Medical History:  Diagnosis Date   BRCA negative 07/2022   MyRisk neg except PALB2 VUS   Family history of breast cancer 07/2022   IBIS=16.6%/riskscore=9.3%   Kidney stones     Past Surgical History:  Procedure Laterality Date   AUGMENTATION MAMMAPLASTY Bilateral 2000   HIATAL HERNIA REPAIR      Prior to Admission medications   Medication Sig Start Date End Date Taking? Authorizing Provider  atropine 1 % ophthalmic solution 2 drops. 10/12/22   [provider]   busPIRone (BUSPAR) 5 MG tablet Take 5 mg by mouth daily. 06/26/21   [provider]  cetirizine (ZYRTEC) 10 MG tablet Take 10 mg by mouth daily. 04/07/22   [provider]  cyclobenzaprine (FLEXERIL) 10 MG tablet Take 1 tablet (10 mg total) by mouth at bedtime as needed for muscle spasms. 10/21/22   Drema Dallas, DO  diclofenac (VOLTAREN) 75 MG EC tablet Take 1 tablet (75 mg total) by mouth 2 (two) times daily. 10/22/21   Felecia Shelling, DPM  Difluprednate 0.05 % EMUL Place into the left eye. 10/12/22   [provider]  escitalopram (LEXAPRO) 20 MG tablet Take 20 mg by mouth daily. 07/24/21   [provider]  Galcanezumab-gnlm (EMGALITY) 120 MG/ML SOAJ ADMINISTER 1 ML UNDER THE SKIN EVERY 28 DAYS 10/21/22   Drema Dallas, DO  hydrocortisone (PROCTOZONE-HC) 2.5 % rectal cream Place 1 Application rectally 2 (two) times daily. 07/17/22   Copland, Ilona Sorrel, PA-C  hydrocortisone 2.5 % cream  04/07/19   [provider]  hydrocortisone 2.5 % cream Place rectally. 08/13/20   [provider]  hydrOXYzine (ATARAX/VISTARIL) 25 MG tablet Take 1 tablet (25 mg total) by mouth every 6 (six) hours as needed for anxiety. 07/21/19   Vena Austria, MD  meloxicam (MOBIC) 15 MG tablet TAKE 1 TABLET(15 MG) BY MOUTH DAILY 05/21/21   Felecia Shelling, DPM  methylPREDNISolone (MEDROL DOSEPAK) 4 MG TBPK tablet 6 day dose pack -  take as directed 04/03/22   Felecia Shelling, DPM  PRED FORTE 1 % ophthalmic suspension Place 1 drop into the left eye 3 (three) times daily. 06/18/21   [provider]  predniSONE (DELTASONE) 20 MG tablet Take 20 mg by mouth daily. 06/03/21   [provider]  RHOFADE 1 % CREA  05/23/19   [provider]  Ubrogepant ([name]) 100 MG TABS TAKE 1 TABLET BY MOUTH AS NEEDED(MAY REPEAT AFTER 2 HOURS. MAX 2/24 HOURS) 11/24/22   Drema Dallas, DO  valACYclovir (VALTREX) 500 MG tablet Take 1 tablet (500 mg total) by mouth daily. 08/20/22    Copland, Ilona Sorrel, PA-C    Family History  Problem Relation Age of Onset   Skin cancer Mother    Breast cancer Mother 11   Thyroid disease Mother    Skin cancer Father    Pancreatic cancer Maternal Grandmother        or stomach     Social History   Tobacco Use   Smoking status: Never   Smokeless tobacco: Never  Vaping Use   Vaping Use: Never used  Substance Use Topics   Alcohol use: Yes    Comment: Occ   Drug use: Never    Allergies as of 12/04/2022 - Review Complete 10/21/2022  Allergen Reaction Noted   Latex Rash 12/27/2018   Penicillins Rash 05/27/2018   Sulfa antibiotics Hives 12/03/2022    Review of Systems:    All systems reviewed and negative except where noted in HPI.   Physical Exam:  There were no vitals taken for this visit. No LMP recorded. Patient is perimenopausal. Psych:  Alert and cooperative. Normal mood and affect. General:   Alert,  Well-developed, well-nourished, pleasant and cooperative in NAD Head:  Normocephalic and atraumatic. Eyes:  Sclera clear, no icterus.   Conjunctiva pink. Neck:  Supple; no masses or thyromegaly. Lungs:  Respirations even and unlabored.  Clear throughout to auscultation.   No wheezes, crackles, or rhonchi. No acute distress. Heart:  Regular rate and rhythm; no murmurs, clicks, rubs, or gallops. Abdomen:  Normal bowel sounds.  No bruits.  Soft, and non-distended without masses, hepatosplenomegaly or hernias noted.  No Tenderness.  No guarding or rebound tenderness.  Rectal: Normal rectal exam.  No external hemorrhoids, rashes, or fissures.  No internal rectal masses Neurologic:  Alert and oriented x3;  grossly normal neurologically. Psych:  Alert and cooperative. Normal mood and affect.  Imaging Studies: No results found.  Assessment and Plan:   Renee Wheeler is a 57 y.o. y/o female has been referred for multiple lower GI symptoms.  She has constipation, diarrhea, rectal bleeding, hemorrhoids, and lower abdominal  cramping.  Symptoms are consistent with irritable bowel syndrome.  Inflammatory bowel disease is also in the differential.  Recently had iritis and positive blood test for HLA-B27.  She was told to follow-up with GI to schedule colonoscopy.  She has never had a colonoscopy.  No family history of colon cancer.  This: There are no external hemorrhoids on exam today.  No fissure.  IBS-mixed; irritable bowel syndrome with chronic constipation and diarrhea episode  IBS patient education given.  Start Benefiber powder, mix 1 tablespoon in a drink once daily.  Add OTC Colace stool softener or Senokot if needed for constipation.  2.   Rectal bleeding  Scheduling Colonoscopy I discussed risks of colonoscopy with patient to include risk of bleeding, colon perforation, and risk of sedation.  Patient expressed understanding and agrees to  proceed with colonoscopy.       Follow up in 3 months.  Renee Amy, PA-C

## 2022-12-04 ENCOUNTER — Encounter: Payer: Self-pay | Admitting: Physician Assistant

## 2022-12-04 ENCOUNTER — Ambulatory Visit: Payer: 59 | Admitting: Physician Assistant

## 2022-12-04 ENCOUNTER — Other Ambulatory Visit: Payer: Self-pay

## 2022-12-04 VITALS — BP 125/83 | HR 60 | Temp 97.7°F | Ht 63.0 in | Wt 144.8 lb

## 2022-12-04 DIAGNOSIS — K582 Mixed irritable bowel syndrome: Secondary | ICD-10-CM | POA: Diagnosis not present

## 2022-12-04 DIAGNOSIS — R197 Diarrhea, unspecified: Secondary | ICD-10-CM

## 2022-12-04 DIAGNOSIS — K625 Hemorrhage of anus and rectum: Secondary | ICD-10-CM

## 2022-12-04 DIAGNOSIS — K5904 Chronic idiopathic constipation: Secondary | ICD-10-CM

## 2022-12-04 MED ORDER — BENEFIBER PO POWD: ORAL | 0 refills | Status: DC

## 2022-12-04 MED ORDER — PEG 3350-KCL-NA BICARB-NACL 420 G PO SOLR: 4000.0000 mL | Freq: Once | ORAL | 0 refills | Status: AC

## 2022-12-04 NOTE — Patient Instructions (Signed)
Start Benefiber powder, mix 1 tablespoon in a drink once daily. If you are constipated, then take OTC Colace stool softener 100 mg 1 capsule once daily. You may also add OTC Senokot 2 tablets once or twice daily for constipation if needed.

## 2023-02-02 ENCOUNTER — Encounter: Payer: Self-pay | Admitting: Anesthesiology

## 2023-02-02 ENCOUNTER — Encounter: Payer: Self-pay | Admitting: Gastroenterology

## 2023-02-03 ENCOUNTER — Other Ambulatory Visit: Payer: Self-pay

## 2023-02-03 ENCOUNTER — Telehealth: Payer: Self-pay

## 2023-02-03 DIAGNOSIS — K5904 Chronic idiopathic constipation: Secondary | ICD-10-CM

## 2023-02-03 DIAGNOSIS — R197 Diarrhea, unspecified: Secondary | ICD-10-CM

## 2023-02-03 NOTE — Telephone Encounter (Addendum)
PT wants to know why procedure was coded as K62.5  Hemorrhage of anus and rectum? Pt states if use this code she has to pay 2000.00 up front. Pt wants to know if we can code as screening so she doesn't have to pay. Pt states she already knows she has Hemorrhage. If it can't be changed pt want to cancel procedure   Disregard note pt understand  2:27PM PT wants to talk to PA or CMA to makes sure she understands.

## 2023-02-05 ENCOUNTER — Other Ambulatory Visit: Payer: Self-pay

## 2023-02-05 ENCOUNTER — Encounter
Admission: RE | Disposition: A | Discharge status: Home / Self Care | Payer: Self-pay | Source: Home / Self Care | Attending: Gastroenterology

## 2023-02-05 ENCOUNTER — Encounter: Payer: Self-pay | Admitting: Gastroenterology

## 2023-02-05 ENCOUNTER — Ambulatory Visit
Admission: RE | Admit: 2023-02-05 | Discharge: 2023-02-05 | Disposition: A | Discharge status: Home / Self Care | Payer: 59 | Attending: Gastroenterology | Admitting: Gastroenterology

## 2023-02-05 ENCOUNTER — Ambulatory Visit: Payer: 59 | Admitting: Anesthesiology

## 2023-02-05 DIAGNOSIS — R197 Diarrhea, unspecified: Secondary | ICD-10-CM

## 2023-02-05 DIAGNOSIS — K5904 Chronic idiopathic constipation: Secondary | ICD-10-CM

## 2023-02-05 DIAGNOSIS — Z1211 Encounter for screening for malignant neoplasm of colon: Secondary | ICD-10-CM

## 2023-02-05 DIAGNOSIS — K641 Second degree hemorrhoids: Secondary | ICD-10-CM | POA: Diagnosis not present

## 2023-02-05 DIAGNOSIS — K5730 Diverticulosis of large intestine without perforation or abscess without bleeding: Secondary | ICD-10-CM | POA: Diagnosis not present

## 2023-02-05 HISTORY — DX: Migraine, unspecified, not intractable, without status migrainosus: G43.909

## 2023-02-05 HISTORY — DX: Unspecified hemorrhoids: K64.9

## 2023-02-05 HISTORY — PX: COLONOSCOPY WITH PROPOFOL: SHX5780

## 2023-02-05 HISTORY — DX: Dizziness and giddiness: R42

## 2023-02-05 HISTORY — DX: Dermatitis, unspecified: L30.9

## 2023-02-05 SURGERY — COLONOSCOPY WITH PROPOFOL
Anesthesia: General

## 2023-02-05 MED ORDER — LIDOCAINE HCL (CARDIAC) PF 100 MG/5ML IV SOSY
PREFILLED_SYRINGE | INTRAVENOUS | Status: DC | PRN
  Administered 2023-02-05: 60 mg via INTRAVENOUS

## 2023-02-05 MED ORDER — SODIUM CHLORIDE 0.9 % IV SOLN: INTRAVENOUS | Status: DC

## 2023-02-05 MED ORDER — STERILE WATER FOR IRRIGATION IR SOLN
Status: DC | PRN
  Administered 2023-02-05: 100 mL

## 2023-02-05 MED ORDER — LACTATED RINGERS IV SOLN: INTRAVENOUS | Status: DC

## 2023-02-05 MED ORDER — STERILE WATER FOR IRRIGATION IR SOLN
Status: DC | PRN
  Administered 2023-02-05: 180 mL

## 2023-02-05 MED ORDER — PROPOFOL 10 MG/ML IV BOLUS
INTRAVENOUS | Status: DC | PRN
Start: 2023-02-05 — End: 2023-02-05
  Administered 2023-02-05: 50 mg via INTRAVENOUS
  Administered 2023-02-05: 90 mg via INTRAVENOUS
  Administered 2023-02-05 (×2): 40 mg via INTRAVENOUS
  Administered 2023-02-05: 50 mg via INTRAVENOUS

## 2023-02-05 SURGICAL SUPPLY — 6 items
GOWN CVR UNV OPN BCK APRN NK (MISCELLANEOUS) ×2 IMPLANT
GOWN ISOL THUMB LOOP REG UNIV (MISCELLANEOUS) ×2
KIT PRC NS LF DISP ENDO (KITS) ×1 IMPLANT
KIT PROCEDURE OLYMPUS (KITS) ×1
MANIFOLD NEPTUNE II (INSTRUMENTS) ×1 IMPLANT
WATER STERILE IRR 250ML POUR (IV SOLUTION) ×1 IMPLANT

## 2023-02-05 NOTE — Anesthesia Preprocedure Evaluation (Addendum)
Anesthesia Evaluation  Patient identified by MRN, date of birth, ID band Patient awake    Reviewed: Allergy & Precautions, H&P , NPO status , Patient's Chart, lab work & pertinent test results  Airway Mallampati: II  TM Distance: >3 FB Neck ROM: full    Dental no notable dental hx.    Pulmonary neg pulmonary ROS   Pulmonary exam normal        Cardiovascular negative cardio ROS Normal cardiovascular exam     Neuro/Psych  Headaches  negative psych ROS   GI/Hepatic negative GI ROS, Neg liver ROS,,,  Endo/Other  negative endocrine ROS    Renal/GU negative Renal ROS  negative genitourinary   Musculoskeletal Back pain   Abdominal   Peds  Hematology negative hematology ROS (+)   Anesthesia Other Findings Past Medical History: 07/2022: BRCA negative     Comment:  MyRisk neg except PALB2 VUS No date: Eczema 07/2022: Family history of breast cancer     Comment:  IBIS=16.6%/riskscore=9.3% No date: Hemorrhoids No date: Kidney stones No date: Migraine headache     Comment:  1-2 per month since Emgality No date: Vertigo     Comment:  1 episode several yrs ago with severe migraine  Past Surgical History: 2000: AUGMENTATION MAMMAPLASTY; Bilateral No date: HIATAL HERNIA REPAIR  BMI    Body Mass Index: 25.69 kg/m      Reproductive/Obstetrics negative OB ROS                             Anesthesia Physical Anesthesia Plan  ASA: 2  Anesthesia Plan: General   Post-op Pain Management:    Induction: Intravenous  PONV Risk Score and Plan: Propofol infusion and TIVA  Airway Management Planned: Natural Airway  Additional Equipment:   Intra-op Plan:   Post-operative Plan:   Informed Consent: I have reviewed the patients History and Physical, chart, labs and discussed the procedure including the risks, benefits and alternatives for the proposed anesthesia with the patient or authorized  representative who has indicated his/her understanding and acceptance.     Dental Advisory Given  Plan Discussed with: CRNA and Surgeon  Anesthesia Plan Comments:         Anesthesia Quick Evaluation

## 2023-02-05 NOTE — H&P (Signed)
Renee Minium, MD [facility] [address] [place], [place] [address] Phone: [phone] Fax : [phone]  Primary Care Physician:  Pcp, No Primary Gastroenterologist:  Dr. Servando Snare  Pre-Procedure History & Physical: HPI:  Renee Wheeler is a 57 y.o. female is here for a screening colonoscopy.   Past Medical History:  Diagnosis Date   BRCA negative 07/2022   MyRisk neg except PALB2 VUS   Eczema    Family history of breast cancer 07/2022   IBIS=16.6%/riskscore=9.3%   Hemorrhoids    Kidney stones    Migraine headache    1-2 per month since Emgality   Vertigo    1 episode several yrs ago with severe migraine    Past Surgical History:  Procedure Laterality Date   AUGMENTATION MAMMAPLASTY Bilateral 2000   HIATAL HERNIA REPAIR      Prior to Admission medications   Medication Sig Start Date End Date Taking? Authorizing Provider  atropine 1 % ophthalmic solution 2 drops. 10/12/22  Yes [provider]  busPIRone (BUSPAR) 5 MG tablet Take 5 mg by mouth daily as needed. 06/26/21  Yes [provider]  cetirizine (ZYRTEC) 10 MG tablet Take 10 mg by mouth daily as needed. 04/07/22  Yes [provider]  cholecalciferol (VITAMIN D3) 25 MCG (1000 UNIT) tablet Take 1,000 Units by mouth daily.   Yes [provider]  escitalopram (LEXAPRO) 20 MG tablet Take 20 mg by mouth daily. 07/24/21  Yes [provider]  Galcanezumab-gnlm (EMGALITY) 120 MG/ML SOAJ ADMINISTER 1 ML UNDER THE SKIN EVERY 28 DAYS 10/21/22  Yes Everlena Cooper, Adam R, DO  hydrocortisone (PROCTOZONE-HC) 2.5 % rectal cream Place 1 Application rectally 2 (two) times daily. Patient taking differently: Place 1 Application rectally 2 (two) times daily as needed. 07/17/22  Yes Copland, Ilona Sorrel, PA-C  hydrocortisone 2.5 % cream  04/07/19  Yes [provider]  RHOFADE 1 % CREA  05/23/19  Yes [provider]  Ubrogepant ([name]) 100 MG TABS TAKE 1 TABLET BY MOUTH AS NEEDED(MAY  REPEAT AFTER 2 HOURS. MAX 2/24 HOURS) 11/24/22  Yes Everlena Cooper, Adam R, DO  valACYclovir (VALTREX) 500 MG tablet Take 1 tablet (500 mg total) by mouth daily. 08/20/22  Yes Copland, Helmut Muster B, PA-C  Wheat Dextrin (BENEFIBER) POWD Mix 1 heaping tablespoon in a drink once daily. 12/04/22  Yes Celso Amy, PA-C  cyclobenzaprine (FLEXERIL) 10 MG tablet Take 1 tablet (10 mg total) by mouth at bedtime as needed for muscle spasms. Patient not taking: Reported on 02/02/2023 10/21/22   Drema Dallas, DO  diclofenac (VOLTAREN) 75 MG EC tablet Take 1 tablet (75 mg total) by mouth 2 (two) times daily. Patient not taking: Reported on 02/02/2023 10/22/21   Felecia Shelling, DPM  Difluprednate 0.05 % EMUL Place into the left eye. 10/12/22   [provider]  fluconazole (DIFLUCAN) 150 MG tablet Take 150 mg by mouth once. Patient not taking: Reported on 02/02/2023 12/02/22   [provider]  meloxicam (MOBIC) 15 MG tablet TAKE 1 TABLET(15 MG) BY MOUTH DAILY Patient not taking: Reported on 02/02/2023 05/21/21   Felecia Shelling, DPM    Allergies as of 12/04/2022 - Review Complete 12/04/2022  Allergen Reaction Noted   Latex Rash 12/27/2018   Penicillins Rash 05/27/2018   Sulfa antibiotics Hives 12/03/2022    Family History  Problem Relation Age of Onset   Skin cancer Mother    Breast cancer Mother 91   Thyroid disease Mother    Skin  cancer Father    Pancreatic cancer Maternal Grandmother        or stomach    Social History   Socioeconomic History   Marital status: Married    Spouse name: Not on file   Number of children: Not on file   Years of education: Not on file   Highest education level: Not on file  Occupational History   Not on file  Tobacco Use   Smoking status: Never   Smokeless tobacco: Never  Vaping Use   Vaping status: Never Used  Substance and Sexual Activity   Alcohol use: Yes    Comment: Occ   Drug use: Never   Sexual activity: Not Currently    Birth control/protection: None,  Post-menopausal  Other Topics Concern   Not on file  Social History Narrative   Right handed   Live with husband one story   Social Determinants of Health   Financial Resource Strain: Medium Risk (09/16/2020)   Received from [facility]   Overall Financial Resource Strain (CARDIA)    Difficulty of Paying Living Expenses: Somewhat hard  Food Insecurity: No Food Insecurity (03/13/2021)   Received from [facility]   Hunger Vital Sign    Worried About Running Out of Food in the Last Year: Never true    Ran Out of Food in the Last Year: Never true  Transportation Needs: Not on file  Physical Activity: Not on file  Stress: No Stress Concern Present (09/16/2020)   Received from [facility] - Occupational Stress Questionnaire    Feeling of Stress : Not at all  Social Connections: Unknown (11/11/2021)   [facility] from [facility]   Social Network    Social Network: Not on file  Intimate Partner Violence: Unknown (10/03/2021)   Received from [facility]   HITS    Physically Hurt: Not on file    Insult or Talk Down To: Not on file    Threaten Physical Harm: Not on file    Scream or Curse: Not on file    Review of Systems: See HPI, otherwise negative ROS  Physical Exam: BP 129/78   Temp (!) 97.4 F (36.3 C) (Temporal)   Resp 13   Ht 5' 2.99" (1.6 m)   Wt 66.7 kg   LMP 11/24/2017 (Approximate)   SpO2 100%   BMI 26.05 kg/m  General:   Alert,  pleasant and cooperative in NAD Head:  Normocephalic and atraumatic. Neck:  Supple; no masses or thyromegaly. Lungs:  Clear throughout to auscultation.    Heart:  Regular rate and rhythm. Abdomen:  Soft, nontender and nondistended. Normal bowel sounds, without guarding, and without rebound.   Neurologic:  Alert and  oriented x4;  grossly normal neurologically.  Impression/Plan: Renee Wheeler is now here to undergo a screening colonoscopy.  Risks, benefits, and alternatives regarding  colonoscopy have been reviewed with the patient.  Questions have been answered.  All parties agreeable.

## 2023-02-05 NOTE — Anesthesia Postprocedure Evaluation (Signed)
Anesthesia Post Note  Patient: Renee Wheeler  Procedure(s) Performed: COLONOSCOPY WITH PROPOFOL  Patient location during evaluation: PACU Anesthesia Type: General Level of consciousness: awake and alert Pain management: pain level controlled Vital Signs Assessment: post-procedure vital signs reviewed and stable Respiratory status: spontaneous breathing, nonlabored ventilation and respiratory function stable Cardiovascular status: blood pressure returned to baseline and stable Postop Assessment: no apparent nausea or vomiting Anesthetic complications: no   No notable events documented.   Last Vitals:  Vitals:   02/05/23 1029 02/05/23 1041  BP: 100/65 115/77  Pulse: 79 66  Resp: (!) 22 (!) 28  Temp:    SpO2: 100% 99%    Last Pain:  Vitals:   02/05/23 1041  TempSrc:   PainSc: 0-No pain                 Foye Deer

## 2023-02-05 NOTE — Transfer of Care (Signed)
Immediate Anesthesia Transfer of Care Note  Patient: Renee Wheeler  Procedure(s) Performed: COLONOSCOPY WITH PROPOFOL  Patient Location: PACU  Anesthesia Type: General  Level of Consciousness: awake, alert  and patient cooperative  Airway and Oxygen Therapy: Patient Spontanous Breathing and Patient connected to supplemental oxygen  Post-op Assessment: Post-op Vital signs reviewed, Patient's Cardiovascular Status Stable, Respiratory Function Stable, Patent Airway and No signs of Nausea or vomiting  Post-op Vital Signs: Reviewed and stable  Complications: No notable events documented.

## 2023-02-05 NOTE — Op Note (Signed)
[facility] Gastroenterology Patient Name: Renee Wheeler Procedure Date: 02/05/2023 9:50 AM MRN: [id number] Account #: [id number] Date of Birth: 1966-04-29 Admit Type: Outpatient Age: 57 Room: [facility] OR ROOM 01 Gender: Female Note Status: Finalized Instrument Name: [id number] Procedure:             Colonoscopy Indications:           Screening for colorectal malignant neoplasm Providers:             Midge Minium MD, MD Medicines:             Propofol per Anesthesia Complications:         No immediate complications. Procedure:             Pre-Anesthesia Assessment:                        - Prior to the procedure, a History and Physical was                         performed, and patient medications and allergies were                         reviewed. The patient's tolerance of previous                         anesthesia was also reviewed. The risks and benefits                         of the procedure and the sedation options and risks                         were discussed with the patient. All questions were                         answered, and informed consent was obtained. Prior                         Anticoagulants: The patient has taken no anticoagulant                         or antiplatelet agents. ASA Grade Assessment: II - A                         patient with mild systemic disease. After reviewing                         the risks and benefits, the patient was deemed in                         satisfactory condition to undergo the procedure.                        After obtaining informed consent, the colonoscope was                         passed under direct vision. Throughout the procedure,                         the patient's blood pressure,  pulse, and oxygen                         saturations were monitored continuously. The was                         introduced through the anus and advanced to the the                         cecum, identified by  appendiceal orifice and ileocecal                         valve. The colonoscopy was performed without                         difficulty. The patient tolerated the procedure well.                         The quality of the bowel preparation was excellent. Findings:      The perianal and digital rectal examinations were normal.      A single large-mouthed diverticulum was found in the ascending colon.      Non-bleeding internal hemorrhoids were found during retroflexion. The       hemorrhoids were Grade II (internal hemorrhoids that prolapse but reduce       spontaneously). Impression:            - Diverticulosis in the ascending colon.                        - Non-bleeding internal hemorrhoids.                        - No specimens collected. Recommendation:        - Discharge patient to home.                        - Resume previous diet.                        - Continue present medications. Procedure Code(s):     --- Professional ---                        [phone], Colonoscopy, flexible; diagnostic, including                         collection of specimen(s) by brushing or washing, when                         performed (separate procedure) Diagnosis Code(s):     --- Professional ---                        Z12.11, Encounter for screening for malignant neoplasm                         of colon CPT copyright 2022 American Medical Association. All rights reserved. The codes documented in this report are preliminary and upon coder review may  be revised to meet current compliance requirements. Midge Minium MD, MD 02/05/2023 10:22:34 AM This report has been signed electronically. Number  of Addenda: 0 Note Initiated On: 02/05/2023 9:50 AM Scope Withdrawal Time: 0 hours 9 minutes 14 seconds  Total Procedure Duration: 0 hours 15 minutes 49 seconds  Estimated Blood Loss:  Estimated blood loss: none.      [facility]

## 2023-02-08 ENCOUNTER — Encounter: Payer: Self-pay | Admitting: Gastroenterology

## 2023-02-11 ENCOUNTER — Telehealth: Payer: Self-pay

## 2023-02-11 NOTE — Telephone Encounter (Signed)
Patient is calling and she wants to know what DR. Servando Snare found In her colonoscopy that she had done on 02/05/2023

## 2023-02-15 ENCOUNTER — Telehealth: Payer: Self-pay | Admitting: Gastroenterology

## 2023-02-15 NOTE — Telephone Encounter (Signed)
Pt left message to get results of procedure on [phone] requesting call back

## 2023-02-16 ENCOUNTER — Telehealth: Payer: Self-pay | Admitting: Gastroenterology

## 2023-02-16 NOTE — Telephone Encounter (Signed)
 Patient is calling and she wants to know what DR. Servando Snare found In her colonoscopy that she had done on 02/05/2023

## 2023-02-16 NOTE — Telephone Encounter (Signed)
Pt call [facility] wanting to know the results of her colonoscopy 02-05-2023.

## 2023-02-17 NOTE — Telephone Encounter (Signed)
I spoke to pt and and gave her the information per Dr Servando Snare... Pt expressed understanding, nothing further needed

## 2023-07-12 ENCOUNTER — Other Ambulatory Visit: Payer: Self-pay | Admitting: Obstetrics and Gynecology

## 2023-07-12 DIAGNOSIS — Z1231 Encounter for screening mammogram for malignant neoplasm of breast: Secondary | ICD-10-CM

## 2023-07-20 NOTE — Telephone Encounter (Signed)
 error

## 2023-07-22 ENCOUNTER — Encounter: Payer: Self-pay | Admitting: Neurology

## 2023-09-02 ENCOUNTER — Ambulatory Visit: Payer: 59 | Admitting: Obstetrics and Gynecology

## 2023-09-02 ENCOUNTER — Other Ambulatory Visit (HOSPITAL_COMMUNITY)
Admission: RE | Admit: 2023-09-02 | Discharge: 2023-09-02 | Disposition: A | Discharge status: Home / Self Care | Source: Ambulatory Visit | Attending: Obstetrics and Gynecology | Admitting: Obstetrics and Gynecology

## 2023-09-02 ENCOUNTER — Encounter: Payer: Self-pay | Admitting: Obstetrics and Gynecology

## 2023-09-02 VITALS — BP 97/62 | HR 77 | Ht 64.0 in | Wt 148.0 lb

## 2023-09-02 DIAGNOSIS — Z803 Family history of malignant neoplasm of breast: Secondary | ICD-10-CM

## 2023-09-02 DIAGNOSIS — Z124 Encounter for screening for malignant neoplasm of cervix: Secondary | ICD-10-CM | POA: Diagnosis present

## 2023-09-02 DIAGNOSIS — Z01419 Encounter for gynecological examination (general) (routine) without abnormal findings: Secondary | ICD-10-CM | POA: Diagnosis not present

## 2023-09-02 DIAGNOSIS — Z0000 Encounter for general adult medical examination without abnormal findings: Secondary | ICD-10-CM

## 2023-09-02 DIAGNOSIS — F419 Anxiety disorder, unspecified: Secondary | ICD-10-CM

## 2023-09-02 DIAGNOSIS — A6004 Herpesviral vulvovaginitis: Secondary | ICD-10-CM

## 2023-09-02 DIAGNOSIS — Z1151 Encounter for screening for human papillomavirus (HPV): Secondary | ICD-10-CM | POA: Diagnosis present

## 2023-09-02 DIAGNOSIS — Z1329 Encounter for screening for other suspected endocrine disorder: Secondary | ICD-10-CM

## 2023-09-02 DIAGNOSIS — N951 Menopausal and female climacteric states: Secondary | ICD-10-CM

## 2023-09-02 DIAGNOSIS — Z1231 Encounter for screening mammogram for malignant neoplasm of breast: Secondary | ICD-10-CM

## 2023-09-02 MED ORDER — VEOZAH 45 MG PO TABS
45.0000 mg | ORAL_TABLET | Freq: Every day | ORAL | 2 refills | Status: AC
Start: 2023-09-02 — End: ?

## 2023-09-02 MED ORDER — ESCITALOPRAM OXALATE 20 MG PO TABS
20.0000 mg | ORAL_TABLET | Freq: Every day | ORAL | 1 refills | Status: DC
Start: 2023-09-02 — End: 2023-11-04

## 2023-09-02 MED ORDER — VALACYCLOVIR HCL 500 MG PO TABS
500.0000 mg | ORAL_TABLET | Freq: Every day | ORAL | 1 refills | Status: AC
Start: 2023-09-02 — End: ?

## 2023-09-02 MED ORDER — BUSPIRONE HCL 5 MG PO TABS: 5.0000 mg | ORAL_TABLET | Freq: Two times a day (BID) | ORAL | 1 refills | Status: DC | PRN

## 2023-09-02 NOTE — Progress Notes (Signed)
 PCP: Pcp, No   Chief Complaint  Patient presents with   Gynecologic Exam    No concerns    HPI:      [name] is a 58 y.o. G2P1001 whose LMP was Patient's last menstrual period was 11/24/2017 (approximate)., presents today for her annual examination.  Her menses are absent due to menopause, no PMB. She does have significant vasomotor sx; didn't like how she felt on HRT. Would like meds. Normal LFTs 2/24.  Sex activity: not sexually active. She does not have vaginal dryness.   Last Pap: 07/26/20 Results were: no abnormalities /neg HPV DNA.  Hx of STDs: genital HSV, takes valtrex prn sx but getting frequent sx with stress. Also with hx of cold sores.   Last mammogram: 09/23/22  Results were: normal--routine follow-up in 12 months. Has appt 4/25 There is a FH of breast cancer in her mother. There is no FH of ovarian cancer. There is a FH of pancreatic or stomach cancer in her mom. Pt is MyRisk neg except PALB2 VUS; IBIS=16.6%/riskscore=9.3%  The patient does do self-breast exams.  Colonoscopy: 8/24 with Dr. Servando Snare, no pathology; repeat due after ?10 yrs.  Has long hx of severe constipation, hemorrhoids with rectal bleeding and anal fissures due tears. Pt has done miralax, probiotics, Linzesse without relief. States metamucil used to work for her but not done recently.   Tobacco use: The patient denies current or previous tobacco use. Alcohol use: occas No drug use Exercise: min active  She does get adequate calcium but not Vitamin D in her diet. Hx of Vit D deficiency.  Labs with PCP but not recently. Hx of pre-DM and borderline thyroid per pt.   Hx of anxiety/depression. Was on lexapro 20 mg with buspar 5 mg prn. PCP office closed and pt out of Rx. Feels like she needs to restart meds. Lots of fatigue/depression.    Patient Active Problem List   Diagnosis Date Noted   Special screening for malignant neoplasms, colon 02/05/2023   Cyst of ovary 12/03/2022   Fatigue  12/03/2022   Genital herpes simplex 12/03/2022   Urinary tract infectious disease 12/03/2022   Vitamin D deficiency 08/20/2022   Chronic idiopathic constipation 08/20/2022   Hemorrhoids 08/20/2022   Family history of breast cancer 08/20/2022   Bacterial vaginosis 12/27/2018   Migraine 12/27/2018   Herpes simplex vulvovaginitis 06/10/2018   Hypothyroidism 06/10/2018   Small bowel obstruction (HCC) 09/24/2010    Past Surgical History:  Procedure Laterality Date   AUGMENTATION MAMMAPLASTY Bilateral 2000   COLONOSCOPY WITH PROPOFOL N/A 02/05/2023   Procedure: COLONOSCOPY WITH PROPOFOL;  Surgeon: Midge Minium, MD;  Location: [facility];  Service: Endoscopy;  Laterality: N/A;   HIATAL HERNIA REPAIR      Family History  Problem Relation Age of Onset   Skin cancer Mother    Breast cancer Mother 29   Thyroid disease Mother    Skin cancer Father    Pancreatic cancer Maternal Grandmother        unknown age    Social History   Socioeconomic History   Marital status: Married    Spouse name: Not on file   Number of children: Not on file   Years of education: Not on file   Highest education level: Not on file  Occupational History   Not on file  Tobacco Use   Smoking status: Never   Smokeless tobacco: Never  Vaping Use   Vaping status: Never Used  Substance and  Sexual Activity   Alcohol use: Yes    Comment: Occ   Drug use: Never   Sexual activity: Not Currently    Birth control/protection: Post-menopausal  Other Topics Concern   Not on file  Social History Narrative   Right handed   Live with husband one story   Social Drivers of Health   Financial Resource Strain: Medium Risk (09/16/2020)   Received from [facility], [facility]   Overall Financial Resource Strain (CARDIA)    Difficulty of Paying Living Expenses: Somewhat hard  Food Insecurity: No Food Insecurity (03/13/2021)   Received from [facility], [facility]   Hunger Vital Sign    Worried  About Running Out of Food in the Last Year: Never true    Ran Out of Food in the Last Year: Never true  Transportation Needs: Not on file  Physical Activity: Not on file  Stress: No Stress Concern Present (09/16/2020)   Received from [facility], [facility] - Occupational Stress Questionnaire    Feeling of Stress : Not at all  Social Connections: Unknown (11/11/2021)   Received from [facility], [facility]   Social Network    Social Network: Not on file  Intimate Partner Violence: Unknown (10/03/2021)   Received from [facility], [facility]   HITS    Physically Hurt: Not on file    Insult or Talk Down To: Not on file    Threaten Physical Harm: Not on file    Scream or Curse: Not on file     Current Outpatient Medications:    atropine 1 % ophthalmic solution, 2 drops., Disp: , Rfl:    cetirizine (ZYRTEC) 10 MG tablet, Take 10 mg by mouth daily as needed., Disp: , Rfl:    cholecalciferol (VITAMIN D3) 25 MCG (1000 UNIT) tablet, Take 1,000 Units by mouth daily., Disp: , Rfl:    cyclobenzaprine (FLEXERIL) 10 MG tablet, Take 1 tablet (10 mg total) by mouth at bedtime as needed for muscle spasms., Disp: 30 tablet, Rfl: 5   Fezolinetant (VEOZAH) 45 MG TABS, Take 1 tablet (45 mg total) by mouth daily., Disp: 30 tablet, Rfl: 2   Galcanezumab-gnlm (EMGALITY) 120 MG/ML SOAJ, ADMINISTER 1 ML UNDER THE SKIN EVERY 28 DAYS, Disp: 1 mL, Rfl: 11   hydrocortisone (PROCTOZONE-HC) 2.5 % rectal cream, Place 1 Application rectally 2 (two) times daily. (Patient taking differently: Place 1 Application rectally 2 (two) times daily as needed.), Disp: 30 g, Rfl: 0   hydrocortisone 2.5 % cream, , Disp: , Rfl:    Ubrogepant ([name]) 100 MG TABS, TAKE 1 TABLET BY MOUTH AS NEEDED(MAY REPEAT AFTER 2 HOURS. MAX 2/24 HOURS), Disp: 10 tablet, Rfl: 3   busPIRone (BUSPAR) 5 MG tablet, Take 1 tablet (5 mg total) by mouth 2 (two) times daily as needed., Disp: 30 tablet,  Rfl: 1   escitalopram (LEXAPRO) 20 MG tablet, Take 1 tablet (20 mg total) by mouth daily., Disp: 30 tablet, Rfl: 1   valACYclovir (VALTREX) 500 MG tablet, Take 1 tablet (500 mg total) by mouth daily., Disp: 90 tablet, Rfl: 1     ROS:  Review of Systems  Constitutional:  Positive for fatigue. Negative for fever and unexpected weight change.  Respiratory:  Negative for cough, shortness of breath and wheezing.   Cardiovascular:  Negative for chest pain, palpitations and leg swelling.  Gastrointestinal:  Positive for constipation. Negative for blood in stool, diarrhea, nausea and vomiting.  Endocrine: Negative  for cold intolerance, heat intolerance and polyuria.  Genitourinary:  Negative for dyspareunia, dysuria, flank pain, frequency, genital sores, hematuria, menstrual problem, pelvic pain, urgency, vaginal bleeding, vaginal discharge and vaginal pain.  Musculoskeletal:  Negative for back pain, joint swelling and myalgias.  Skin:  Negative for rash.  Neurological:  Positive for headaches. Negative for dizziness, syncope, light-headedness and numbness.  Hematological:  Negative for adenopathy.  Psychiatric/Behavioral:  Positive for agitation and dysphoric mood. Negative for confusion, sleep disturbance and suicidal ideas. The patient is not nervous/anxious.    BREAST: No symptoms    Objective: BP 97/62   Pulse 77   Ht 5\' 4"  (1.626 m)   Wt 148 lb (67.1 kg)   LMP 11/24/2017 (Approximate)   BMI 25.40 kg/m    Physical Exam Constitutional:      Appearance: She is well-developed.  Genitourinary:     Vulva normal.     Right Labia: No rash, tenderness or lesions.    Left Labia: No tenderness, lesions or rash.    No vaginal discharge, erythema or tenderness.      Right Adnexa: not tender and no mass present.    Left Adnexa: not tender and no mass present.    No cervical friability or polyp.     Uterus is not enlarged or tender.  Breasts:    Right: No mass, nipple discharge, skin  change or tenderness.     Left: No mass, nipple discharge, skin change or tenderness.  Neck:     Thyroid: No thyromegaly.  Cardiovascular:     Rate and Rhythm: Normal rate and regular rhythm.     Heart sounds: Normal heart sounds. No murmur heard. Pulmonary:     Effort: Pulmonary effort is normal.     Breath sounds: Normal breath sounds.  Abdominal:     Palpations: Abdomen is soft.     Tenderness: There is no abdominal tenderness. There is no guarding or rebound.  Musculoskeletal:        General: Normal range of motion.     Cervical back: Normal range of motion.  Lymphadenopathy:     Cervical: No cervical adenopathy.  Neurological:     General: No focal deficit present.     Mental Status: She is alert and oriented to person, place, and time.     Cranial Nerves: No cranial nerve deficit.  Skin:    General: Skin is warm and dry.  Psychiatric:        Mood and Affect: Mood normal.        Behavior: Behavior normal.        Thought Content: Thought content normal.        Judgment: Judgment normal.  Vitals reviewed.       09/02/2023    4:47 PM  GAD 7 : Generalized Anxiety Score  Nervous, Anxious, on Edge 1  Control/stop worrying 1  Worry too much - different things 1  Trouble relaxing 1  Restless 0  Easily annoyed or irritable 2  Afraid - awful might happen 0  Total GAD 7 Score 6  Anxiety Difficulty Somewhat difficult       09/02/2023    4:46 PM  Depression screen PHQ 2/9  Decreased Interest 2  Down, Depressed, Hopeless 2  PHQ - 2 Score 4  Altered sleeping 2  Tired, decreased energy 3  Change in appetite 1  Feeling bad or failure about yourself  1  Trouble concentrating 1  Moving slowly or fidgety/restless 0  Suicidal thoughts  0  PHQ-9 Score 12  Difficult doing work/chores Somewhat difficult    Assessment/Plan:  Encounter for annual routine gynecological examination  Cervical cancer screening - Plan: Cytology - PAP  Screening for HPV (human papillomavirus) -  Plan: Cytology - PAP  Encounter for screening mammogram for malignant neoplasm of breast; pt has mammo appt  Family history of breast cancer--pt is MyRisk neg, no increased screening recommendations  Herpes simplex vulvovaginitis - Plan: valACYclovir (VALTREX) 500 MG tablet; Rx RF eRxd. Takes prn.   Anxiety and depression - Plan: busPIRone (BUSPAR) 5 MG tablet, escitalopram (LEXAPRO) 20 MG tablet; restart Rx lexapro, 1/2 tab daily for 6 days, then 1 tab daily. F/u via MyChart with sx in 2 months/sooner pnr. Rx Rf buspar prn.   Vasomotor symptoms due to menopause - Plan: Fezolinetant (VEOZAH) 45 MG TABS; 2 samples and Rx veozah. Pt to check CMP QMO for 3 months and then month 6. F/u via MyChart in 3 months re: sx and Rx RF.   Constipation--resume fiber supp/lots of water, add colace. If still sx, f/u with GI.  Thyroid disorder screening - Plan: TSH + free T4; chck labs with CMP. Will f/u with results (lab closed now)  Blood tests for routine general physical examination - Plan: Comprehensive metabolic panel; check CMP Q3 months, standing order placed.   Meds ordered this encounter  Medications   busPIRone (BUSPAR) 5 MG tablet    Sig: Take 1 tablet (5 mg total) by mouth 2 (two) times daily as needed.    Dispense:  30 tablet    Refill:  1    Supervising Provider:   Hildred Laser [[id number]]   escitalopram (LEXAPRO) 20 MG tablet    Sig: Take 1 tablet (20 mg total) by mouth daily.    Dispense:  30 tablet    Refill:  1    Supervising Provider:   Hildred Laser [[id number]]   Fezolinetant (VEOZAH) 45 MG TABS    Sig: Take 1 tablet (45 mg total) by mouth daily.    Dispense:  30 tablet    Refill:  2    Supervising Provider:   Hildred Laser [[id number]]   valACYclovir (VALTREX) 500 MG tablet    Sig: Take 1 tablet (500 mg total) by mouth daily.    Dispense:  90 tablet    Refill:  1    Supervising Provider:   Waymon Budge            GYN counsel breast self exam, mammography screening,  menopause, adequate intake of calcium and vitamin D, diet and exercise    F/U  Return in 1 year (on 09/01/2024).  Jeimy Bickert B. Dalvin Clipper, PA-C 09/02/2023 5:01 PM

## 2023-09-02 NOTE — Patient Instructions (Signed)
 I value your feedback and you entrusting [place] with your care. If you get a [place] patient survey, I would appreciate you taking the time to let us know about your experience today. Thank you! ? ? ?

## 2023-09-06 ENCOUNTER — Telehealth: Payer: Self-pay

## 2023-09-07 NOTE — Telephone Encounter (Signed)
 Pt took hormones in the past and didn't feel well on them. Ask pt if she was able to use coupon card and if so, won't need PA. Thx.

## 2023-09-08 LAB — CYTOLOGY - PAP
Adequacy: ABSENT
Comment: NEGATIVE
Diagnosis: NEGATIVE
High risk HPV: NEGATIVE

## 2023-09-08 NOTE — Telephone Encounter (Signed)
 Called pt, no answer, LVMTRC. Sending mychart msg.

## 2023-09-08 NOTE — Telephone Encounter (Signed)
 Pt called back, hasn't been to pharmacy because she's using samples given at office. Will call back to let us know if coupon card doesn't go through.

## 2023-09-23 ENCOUNTER — Telehealth: Payer: Self-pay

## 2023-09-23 NOTE — Telephone Encounter (Signed)
 Pls f/u with pt re: urin sx at appt. Specifically states "no concerns" and we did discuss a lot. She can do UA drop off with RN. Yes, insurance should cover labs but always good to check. It's a CMP which is very inexpensive

## 2023-09-23 NOTE — Telephone Encounter (Signed)
 Called pt, no answer, LVMTRC.

## 2023-09-23 NOTE — Telephone Encounter (Signed)
 Patient calling in today regarding her visit on 09/02/23 with two concerns. 1- She states at that visit she was symptomatic for a UTI and left a urine sample but has not heard back regarding the results. After reviewing the visit note and chart, no urine testing was discussed or resulted. Patient states she remains symptomatic, offered nurse visit at this time. 2- Patient is going to pick up Rx for [facility] and has questions about lab work. She is asking if the lab work required for this medication is covered by insurance. Patient was advised her insurance company would be the best point of contact for this inquiry.   Patient voiced she is going to pick up the medication today and speak with her insurance. Offered to schedule and nurse visit the same day as the lab work, she states she will call back.

## 2023-09-24 NOTE — Telephone Encounter (Signed)
 Marland Kitchen

## 2023-09-26 NOTE — Telephone Encounter (Signed)
 Don't charge pt for nurse visit since had sx at appt.

## 2023-09-27 ENCOUNTER — Ambulatory Visit

## 2023-09-27 NOTE — Telephone Encounter (Signed)
 Pt called back and is aware. She cannot make it today. Transferred to front desk to get rescheduled.

## 2023-09-27 NOTE — Telephone Encounter (Signed)
 Called pt, no answer, LVMTRC.

## 2023-09-29 ENCOUNTER — Ambulatory Visit (INDEPENDENT_AMBULATORY_CARE_PROVIDER_SITE_OTHER)

## 2023-09-29 ENCOUNTER — Other Ambulatory Visit: Payer: Self-pay | Admitting: Obstetrics and Gynecology

## 2023-09-29 ENCOUNTER — Ambulatory Visit
Admission: RE | Admit: 2023-09-29 | Discharge: 2023-09-29 | Disposition: A | Discharge status: Home / Self Care | Payer: 59 | Source: Ambulatory Visit | Attending: Obstetrics and Gynecology | Admitting: Obstetrics and Gynecology

## 2023-09-29 VITALS — BP 120/79 | HR 52 | Ht 63.0 in | Wt 146.9 lb

## 2023-09-29 DIAGNOSIS — R350 Frequency of micturition: Secondary | ICD-10-CM | POA: Diagnosis not present

## 2023-09-29 DIAGNOSIS — Z1231 Encounter for screening mammogram for malignant neoplasm of breast: Secondary | ICD-10-CM

## 2023-09-29 LAB — POCT URINALYSIS DIPSTICK
Bilirubin, UA: NEGATIVE
Blood, UA: NEGATIVE
Glucose, UA: NEGATIVE
Ketones, UA: NEGATIVE
Leukocytes, UA: NEGATIVE
Nitrite, UA: NEGATIVE
Protein, UA: POSITIVE — AB
Spec Grav, UA: 1.01 (ref 1.010–1.025)
Urobilinogen, UA: 0.2 U/dL
pH, UA: 7.5 (ref 5.0–8.0)

## 2023-09-29 NOTE — Progress Notes (Signed)
    NURSE VISIT NOTE  Subjective:    Patient ID: Renee Wheeler, female    DOB: 22-Mar-1966, 58 y.o.   MRN: [id number]       HPI  Patient is a 58 y.o. G47P1001 female who presents for urinary frequency and urinary urgency for 2 weeks.  Patient denies dysuria, hematuria, flank pain, abdominal pain, pelvic pain, cloudy malordorous urine, genital rash, genital irritation, and vaginal discharge.  Patient does have a history of recurrent UTI.  Patient does not have a history of pyelonephritis.    Objective:    BP 120/79   Pulse (!) 52   Ht 5\' 3"  (1.6 m)   Wt 146 lb 14.4 oz (66.6 kg)   LMP 11/24/2017 (Approximate)   BMI 26.02 kg/m    Lab Review  Results for orders placed or performed in visit on 09/29/23  POCT urinalysis dipstick  Result Value Ref Range   Color, UA yellow    Clarity, UA clear    Glucose, UA Negative Negative   Bilirubin, UA negative    Ketones, UA negative    Spec Grav, UA 1.010 1.010 - 1.025   Blood, UA negative    pH, UA 7.5 5.0 - 8.0   Protein, UA Positive (A) Negative   Urobilinogen, UA 0.2 0.2 or 1.0 E.U./dL   Nitrite, UA negative    Leukocytes, UA Negative Negative   Appearance     Odor      Assessment:   1. Urinary frequency      Plan:   Urine Culture Sent. Maintain adequate hydration.  May use AZO OTC prn.  Follow up if symptoms worsen or fail to improve as anticipated, and as needed.    Fonda Kinder, CMA

## 2023-09-29 NOTE — Patient Instructions (Signed)
 Urinary Frequency, Adult Urinary frequency means urinating more often than usual. You may urinate every 1-2 hours even though you drink a normal amount of fluid and do not have a bladder infection or condition. Although you urinate more often than normal, the total amount of urine produced in a day is normal. With urinary frequency, you may have an urgent need to urinate often. The stress and anxiety of needing to find a bathroom quickly can make this urge worse. This condition may go away on its own, or you may need treatment at home. Home treatment may include bladder training, exercises, taking medicines, or making changes to your diet. Follow these instructions at home: Bladder health Your health care provider will tell you what to do to improve bladder health. You may be told to: Keep a bladder diary. Keep track of: What you eat and drink. How often you urinate. How much you urinate. Follow a bladder training program. This may include: Learning to delay going to the bathroom. Double urinating, also called voiding. This helps if you are not completely emptying your bladder. Scheduled voiding. Do Kegel exercises. Kegel exercises strengthen the muscles that help control urination, which may help the condition.  Eating and drinking Follow instructions from your health care provider about eating or drinking restrictions. You may be told to: Avoid caffeine. Drink fewer fluids, especially alcohol. Avoid drinking in the evening. Avoid foods or drinks that may irritate the bladder. These include coffee, tea, soda, artificial sweeteners, citrus, tomato-based foods, and chocolate. Eat foods that help prevent or treat constipation. Constipation can make urinary frequency worse. You may need to take these actions to prevent or treat constipation: Drink enough fluid to keep your urine pale yellow. Take over-the-counter or prescription medicines. Eat foods that are high in fiber, such as beans, whole  grains, and fresh fruits and vegetables. Limit foods that are high in fat and processed sugars, such as fried or sweet foods. General instructions Take over-the-counter and prescription medicines only as told by your health care provider. Keep all follow-up visits. This is important. Contact a health care provider if: You start urinating more often. You feel pain or irritation when you urinate. You notice blood in your urine. Your urine looks cloudy. You develop a fever. You begin vomiting. Get help right away if: You are unable to urinate. Summary Urinary frequency means urinating more often than usual. With urinary frequency, you may urinate every 1-2 hours even though you drink a normal amount of fluid and do not have a bladder infection or other bladder condition. Your health care provider may recommend that you keep a bladder diary, follow a bladder training program, or make dietary changes. If told by your health care provider, do Kegel exercises to strengthen the muscles that help control urination. Take over-the-counter and prescription medicines only as told by your health care provider. Contact a health care provider if your symptoms do not improve or get worse. This information is not intended to replace advice given to you by your health care provider. Make sure you discuss any questions you have with your health care provider. Document Revised: 12/21/2019 Document Reviewed: 01/19/2020 Elsevier Patient Education  2024 [employer].

## 2023-09-30 ENCOUNTER — Encounter: Payer: Self-pay | Admitting: Obstetrics and Gynecology

## 2023-10-01 ENCOUNTER — Ambulatory Visit

## 2023-10-01 LAB — URINE CULTURE

## 2023-10-03 ENCOUNTER — Encounter: Payer: Self-pay | Admitting: Obstetrics and Gynecology

## 2023-10-11 ENCOUNTER — Other Ambulatory Visit (HOSPITAL_COMMUNITY): Payer: Self-pay

## 2023-10-11 ENCOUNTER — Telehealth: Payer: Self-pay | Admitting: Neurology

## 2023-10-11 ENCOUNTER — Telehealth: Payer: Self-pay | Admitting: Pharmacy Technician

## 2023-10-11 MED ORDER — EMGALITY 120 MG/ML ~~LOC~~ SOAJ: SUBCUTANEOUS | 0 refills | Status: DC

## 2023-10-11 NOTE — Telephone Encounter (Signed)
 LMOVM for patient, Emgality refilled. Message sent to the PA team to start a Pa.   Pa team please start a PA for Emgality.

## 2023-10-11 NOTE — Telephone Encounter (Signed)
 Pharmacy Patient Advocate Encounter   Received notification from Pt Calls Messages that prior authorization for [facility] 120MG  is required/requested.   Insurance verification completed.   The patient is insured through [facility] .   Per test claim: PA required; PA submitted to above mentioned insurance via CoverMyMeds Key/confirmation #/EOC [id number] Status is pending

## 2023-10-11 NOTE — Telephone Encounter (Signed)
 Pharmacy Patient Advocate Encounter  Received notification from [facility] that Prior Authorization for [facility] 120MG  has been CANCELLED due to   PT LAST FILLED ON3.31.25 AT [employer] [facility] [address] PHONE#[phone]

## 2023-10-11 NOTE — Telephone Encounter (Signed)
 PA has been submitted, and telephone encounter has been created. Please see telephone encounter dated 4.14.25.  No PA needed. Last fill on 3.31.25.

## 2023-10-11 NOTE — Telephone Encounter (Signed)
LMVOM for patient .

## 2023-10-11 NOTE — Telephone Encounter (Signed)
 Pt. Called as appt moved by our office and will need refill of Rx Emgality, can you please refill prior to next appt..was sched for 10/27/23 also Pt. Rcvd message that Emgality was not approved

## 2023-10-27 ENCOUNTER — Ambulatory Visit: Payer: 59 | Admitting: Neurology

## 2023-11-02 NOTE — Progress Notes (Unsigned)
 NEUROLOGY FOLLOW UP OFFICE NOTE  Renee Wheeler [id number]  Assessment/Plan:   Migraine without aura, without status migrainosus, not intractable    Migraine prevention:  Emgality  every 28 days   *** Migraine rescue:  [name]  100mg   *** Cyclobenzaprine  10mg  at bedtime for neck pain Limit use of pain relievers to no more than 9 days out of the month to prevent risk of rebound or medication-overuse headache. Keep headache diary Follow up 1 year      Subjective:  Renee Wheeler is a 58 year old right-handed white female who follows up for migraines.   UPDATE: Intensity:  Severe to moderate Duration:  2 hours without postdrome headache Frequency:  2-3 a month  Since last year, she has not had a recurrence of the atypically severe migraines.   Frequency of abortive medication: once Current NSAIDS:  none Current analgesics:  none Current triptans:  none Current ergotamine:  none Current anti-emetic:  Zofran  ODT 4mg  Current muscle relaxants:  none Current anti-anxiolytic:  none Current sleep aide:  none Current Antihypertensive medications:  none Current Antidepressant medications:  Lexapro  20mg  daily Current Anticonvulsant medications:  none Current anti-CGRP:  Emgality , [name]  100mg  Current Vitamins/Herbal/Supplements:  none Current Antihistamines/Decongestants:  none Other therapy:  none Hormone/birth control:  none     Caffeine :  1/2 cup coffee in AM.  Rarely Monster drink Diet:  Not enough water .  Sometimes a lemon soda. Exercise:  Not routine Depression:  yes; Anxiety:  yes Other pain:  no Sleep:  variable   HISTORY:  She has had migraines since she was in her 30s.  They are severe pounding and pressure bifrontal headache associated with nausea, photophobia, phonophobia, osmophobia sometimes tinnitus and blurred vision.  No vomiting, numbness or weakness.  They usually last 2 to 4 days.  They have been occurring 5 days a month.  They have been increased by  stress.  No other known triggers.  She went to the ED in April 2020 for migraine with associated dizziness and hearing loss.     Past NSAIDS:  Advil, Toradol  shot (ineffective), [name] Past analgesics:  Fioricet; Tylenol , Excedrin; tramadol Past abortive triptans:  Eletriptan  40mg ; rizatriptan 10mg , sumatriptan  tablet Past abortive ergotamine:  none Past muscle relaxants:  none Past anti-emetic:  none Past antihypertensive medications:  propranolol  Past antidepressant medications:  Nortriptyline  10mg ; escitalopram  10mg ; paroxetine  Past anticonvulsant medications:  topiramate  Past anti-CGRP:  Nurtec (rescue, helped but [name]  more effective) Past vitamins/Herbal/Supplements:  none Past antihistamines/decongestants:  meclizine  Other past therapies:  none     Family history of headache:  no  PAST MEDICAL HISTORY: Past Medical History:  Diagnosis Date   BRCA negative 07/2022   MyRisk neg except PALB2 VUS   Eczema    Family history of breast cancer 07/2022   IBIS=16.6%/riskscore=9.3%   Hemorrhoids    Kidney stones    Migraine headache    1-2 per month since Emgality    Vertigo    1 episode several yrs ago with severe migraine    MEDICATIONS: Current Outpatient Medications on File Prior to Visit  Medication Sig Dispense Refill   atropine 1 % ophthalmic solution 2 drops.     busPIRone  (BUSPAR ) 5 MG tablet Take 1 tablet (5 mg total) by mouth 2 (two) times daily as needed. 30 tablet 1   cetirizine (ZYRTEC) 10 MG tablet Take 10 mg by mouth daily as needed.     cholecalciferol (VITAMIN D3) 25 MCG (1000 UNIT) tablet Take 1,000 Units by mouth daily.  cyclobenzaprine  (FLEXERIL ) 10 MG tablet Take 1 tablet (10 mg total) by mouth at bedtime as needed for muscle spasms. 30 tablet 5   escitalopram  (LEXAPRO ) 20 MG tablet Take 1 tablet (20 mg total) by mouth daily. 30 tablet 1   Fezolinetant  (VEOZAH ) 45 MG TABS Take 1 tablet (45 mg total) by mouth daily. 30 tablet 2   Galcanezumab -gnlm  (EMGALITY ) 120 MG/ML SOAJ ADMINISTER 1 ML UNDER THE SKIN EVERY 28 DAYS 1 mL 0   hydrocortisone  (PROCTOZONE -HC) 2.5 % rectal cream Place 1 Application rectally 2 (two) times daily. (Patient taking differently: Place 1 Application rectally 2 (two) times daily as needed.) 30 g 0   hydrocortisone  2.5 % cream      Ubrogepant  ([name] ) 100 MG TABS TAKE 1 TABLET BY MOUTH AS NEEDED(MAY REPEAT AFTER 2 HOURS. MAX 2/24 HOURS) 10 tablet 3   valACYclovir  (VALTREX ) 500 MG tablet Take 1 tablet (500 mg total) by mouth daily. 90 tablet 1   No current facility-administered medications on file prior to visit.    ALLERGIES: Allergies  Allergen Reactions   Latex Rash    Powdered gloves when worn   Penicillins Rash   Sulfa Antibiotics Hives    FAMILY HISTORY: Family History  Problem Relation Age of Onset   Skin cancer Mother    Breast cancer Mother 70   Thyroid  disease Mother    Skin cancer Father    Pancreatic cancer Maternal Grandmother        unknown age      Objective:  *** General: No acute distress.  Patient appears well-groomed.   Head:  Normocephalic/atraumatic Neck:  Supple.  No paraspinal tenderness.  Full range of motion. Heart:  Regular rate and rhythm. Neuro:  Alert and oriented.  Speech fluent and not dysarthric.  Language intact.  CN II-XII intact.  Bulk and tone normal.  Muscle strength 5/5 throughout.  Deep tendon reflexes 2+ throughout.  Gait normal.  Romberg negative.    Janne Members, DO

## 2023-11-03 ENCOUNTER — Ambulatory Visit: Admitting: Neurology

## 2023-11-03 ENCOUNTER — Encounter: Payer: Self-pay | Admitting: Neurology

## 2023-11-03 ENCOUNTER — Ambulatory Visit: Payer: 59 | Admitting: Neurology

## 2023-11-03 VITALS — BP 108/70 | HR 66 | Ht 64.0 in | Wt 148.0 lb

## 2023-11-03 DIAGNOSIS — G43009 Migraine without aura, not intractable, without status migrainosus: Secondary | ICD-10-CM

## 2023-11-03 MED ORDER — EMGALITY 120 MG/ML ~~LOC~~ SOAJ: SUBCUTANEOUS | 11 refills | Status: AC

## 2023-11-03 MED ORDER — UBRELVY 100 MG PO TABS: 100.0000 mg | ORAL_TABLET | ORAL | 5 refills | Status: AC | PRN

## 2023-11-04 ENCOUNTER — Encounter: Payer: Self-pay | Admitting: Family Medicine

## 2023-11-04 ENCOUNTER — Ambulatory Visit: Admitting: Family Medicine

## 2023-11-04 VITALS — BP 122/83 | HR 63 | Temp 98.0°F | Resp 18 | Ht 64.0 in | Wt 144.0 lb

## 2023-11-04 DIAGNOSIS — J019 Acute sinusitis, unspecified: Secondary | ICD-10-CM | POA: Diagnosis not present

## 2023-11-04 DIAGNOSIS — E559 Vitamin D deficiency, unspecified: Secondary | ICD-10-CM

## 2023-11-04 DIAGNOSIS — Z7689 Persons encountering health services in other specified circumstances: Secondary | ICD-10-CM

## 2023-11-04 DIAGNOSIS — F419 Anxiety disorder, unspecified: Secondary | ICD-10-CM

## 2023-11-04 DIAGNOSIS — F32A Depression, unspecified: Secondary | ICD-10-CM

## 2023-11-04 DIAGNOSIS — B9689 Other specified bacterial agents as the cause of diseases classified elsewhere: Secondary | ICD-10-CM

## 2023-11-04 DIAGNOSIS — R5382 Chronic fatigue, unspecified: Secondary | ICD-10-CM

## 2023-11-04 MED ORDER — BUSPIRONE HCL 5 MG PO TABS: 5.0000 mg | ORAL_TABLET | Freq: Two times a day (BID) | ORAL | 1 refills | Status: AC | PRN

## 2023-11-04 MED ORDER — CETIRIZINE HCL 10 MG PO TABS: 10.0000 mg | ORAL_TABLET | Freq: Every day | ORAL | 1 refills | Status: DC | PRN

## 2023-11-04 MED ORDER — ESCITALOPRAM OXALATE 20 MG PO TABS
20.0000 mg | ORAL_TABLET | Freq: Every day | ORAL | 1 refills | Status: DC
Start: 2023-11-04 — End: 2024-02-03

## 2023-11-04 MED ORDER — DOXYCYCLINE HYCLATE 100 MG PO TABS
100.0000 mg | ORAL_TABLET | Freq: Two times a day (BID) | ORAL | 0 refills | Status: AC
Start: 2023-11-04 — End: 2023-11-11

## 2023-11-04 MED ORDER — FLUCONAZOLE 150 MG PO TABS: 150.0000 mg | ORAL_TABLET | Freq: Once | ORAL | 0 refills | Status: AC

## 2023-11-04 NOTE — Patient Instructions (Signed)
 MyChart:  For all urgent or time sensitive needs we ask that you please call the office to avoid delays. Our number is [phone]) [id number]. MyChart is not constantly monitored and due to the large volume of messages a day, replies may take up to 72 business hours.   MyChart Policy: MyChart allows for you to see your visit notes, after visit summary, provider recommendations, lab and tests results, make an appointment, request refills, and contact your provider or the office for non-urgent questions or concerns. Providers are seeing patients during normal business hours and do not have built in time to review MyChart messages.  We ask that you allow a minimum of 3 business days for responses to [employer]. For this reason, please do not send urgent requests through MyChart. Please call the office at [phone]. New and ongoing conditions may require a visit. We have virtual and in person visit available for your convenience.  Complex MyChart concerns may require a visit. Your provider may request you schedule a virtual or in person visit to ensure we are providing the best care possible. MyChart messages sent after 11:00 AM on Friday will not be received by the provider until Monday morning.    Lab and Test Results: You will receive your lab and test results on MyChart as soon as they are completed and results have been sent by the lab or testing facility. Due to this service, you will receive your results BEFORE your provider.  I review lab and tests results each morning prior to seeing patients. Some results require collaboration with other providers to ensure you are receiving the most appropriate care. For this reason, we ask that you please allow a minimum of 3-5 business days from the time the ALL results have been received for your provider to receive and review lab and test results and contact you about these.  Most lab and test result comments from the provider will be sent through MyChart.  Your provider may recommend changes to the plan of care, follow-up visits, repeat testing, ask questions, or request an office visit to discuss these results. You may reply directly to this message or call the office at [phone] to provide information for the provider or set up an appointment. In some instances, you will be called with test results and recommendations. Please let us know if this is preferred and we will make note of this in your chart to provide this for you.    If you have not heard a response to your lab or test results in 5 business days from all results returning to MyChart, please call the office to let us know. We ask that you please avoid calling prior to this time unless there is an emergent concern. Due to high call volumes, this can delay the resulting process.   After Hours: For all non-emergency after hours needs, please call the office at [phone] and select the option to reach the on-call provider service. On-call services are shared between multiple [facility] offices and therefore it will not be possible to speak directly with your provider. On-call providers may provide medical advice and recommendations, but are unable to provide refills for maintenance medications.  For all emergency or urgent medical needs after normal business hours, we recommend that you seek care at the closest Urgent Care or Emergency Department to ensure appropriate treatment in a timely manner.  [facility] has a 24 hour emergency room located on the ground floor for your  convenience.    Urgent Concerns During the Business Day Providers are seeing patients from 8AM to 5PM, Monday through Thursday, and 8AM to 12PM on Friday with a busy schedule and are most often not able to respond to non-urgent calls until the end of the day or the next business day. If you should have URGENT concerns during the day, please call and speak to the nurse or schedule a same day  appointment so that we can address your concern without delay.    Thank you, again, for choosing me as your health care partner. I appreciate your trust and look forward to learning more about you.    Renee Reedy, FNP-C

## 2023-11-04 NOTE — Progress Notes (Signed)
 New Patient Office Visit  Subjective   Patient ID: Renee Wheeler, female    DOB: 1966-02-22  Age: 58 y.o. MRN: [id number]  CC:  Chief Complaint  Patient presents with   Establish Care   Fatigue        Medical Management of Chronic Issues    HPI Renee Wheeler is a 58 year old female who presents to establish with [facility].   CC: Patient here to establish care  Last PCP: Dr. Ziglar  Specialists: none   PMHx: migraines, anxiety, depression, seasonal allergies   She has been taking Claritin and has not noticed an improvement in symptoms.  Sore throat x2 months, ear ache with muffled sounds, itchy ears, watery eyes, headache, non-productive cough Denies fever/chills, chest pain, shortness of breath, wheezing      09/02/2023    4:47 PM  GAD 7 : Generalized Anxiety Score  Nervous, Anxious, on Edge 1  Control/stop worrying 1  Worry too much - different things 1  Trouble relaxing 1  Restless 0  Easily annoyed or irritable 2  Afraid - awful might happen 0  Total GAD 7 Score 6  Anxiety Difficulty Somewhat difficult       09/02/2023    4:46 PM 03/10/2019   10:07 AM  PHQ9 SCORE ONLY  PHQ-9 Total Score 12 0    Outpatient Encounter Medications as of 11/04/2023  Medication Sig   cholecalciferol (VITAMIN D3) 25 MCG (1000 UNIT) tablet Take 1,000 Units by mouth daily.   cyclobenzaprine  (FLEXERIL ) 10 MG tablet Take 1 tablet (10 mg total) by mouth at bedtime as needed for muscle spasms.   Fezolinetant  (VEOZAH ) 45 MG TABS Take 1 tablet (45 mg total) by mouth daily.   Galcanezumab -gnlm (EMGALITY ) 120 MG/ML SOAJ ADMINISTER 1 ML UNDER THE SKIN EVERY 28 DAYSADMINISTER 1 ML UNDER THE SKIN EVERY 28 DAYS   hydrocortisone  (PROCTOZONE -HC) 2.5 % rectal cream Place 1 Application rectally 2 (two) times daily. (Patient taking differently: Place 1 Application rectally 2 (two) times daily as needed.)   Ubrogepant  ([name] ) 100 MG TABS Take 1 tablet (100 mg total) by  mouth as needed. TAKE 1 TABLET BY MOUTH AS NEEDED(MAY REPEAT AFTER 2 HOURS. MAX 2/24 HOURS)   valACYclovir  (VALTREX ) 500 MG tablet Take 1 tablet (500 mg total) by mouth daily.   [DISCONTINUED] busPIRone  (BUSPAR ) 5 MG tablet Take 1 tablet (5 mg total) by mouth 2 (two) times daily as needed.   [DISCONTINUED] cetirizine (ZYRTEC) 10 MG tablet Take 10 mg by mouth daily as needed.   [DISCONTINUED] escitalopram  (LEXAPRO ) 20 MG tablet Take 1 tablet (20 mg total) by mouth daily.   busPIRone  (BUSPAR ) 5 MG tablet Take 1 tablet (5 mg total) by mouth 2 (two) times daily as needed.   cetirizine (ZYRTEC) 10 MG tablet Take 1 tablet (10 mg total) by mouth daily as needed.   escitalopram  (LEXAPRO ) 20 MG tablet Take 1 tablet (20 mg total) by mouth daily.   [DISCONTINUED] atropine 1 % ophthalmic solution 2 drops. (Patient not taking: Reported on 11/04/2023)   [DISCONTINUED] hydrocortisone  2.5 % cream  (Patient not taking: Reported on 11/04/2023)   No facility-administered encounter medications on file as of 11/04/2023.    Patient Active Problem List   Diagnosis Date Noted   Special screening for malignant neoplasms, colon 02/05/2023   Cyst of ovary 12/03/2022   Fatigue 12/03/2022   Genital herpes simplex 12/03/2022   Urinary tract infectious disease 12/03/2022   Vitamin D  deficiency 08/20/2022  Chronic idiopathic constipation 08/20/2022   Hemorrhoids 08/20/2022   Family history of breast cancer 08/20/2022   Bacterial vaginosis 12/27/2018   Migraine 12/27/2018   Herpes simplex vulvovaginitis 06/10/2018   Hypothyroidism 06/10/2018   Small bowel obstruction (HCC) 09/24/2010   Past Medical History:  Diagnosis Date   BRCA negative 07/2022   MyRisk neg except PALB2 VUS   Eczema    Family history of breast cancer 07/2022   IBIS=16.6%/riskscore=9.3%   Hemorrhoids    Kidney stones    Migraine headache    1-2 per month since Emgality    Vertigo    1 episode several yrs ago with severe migraine   Past  Surgical History:  Procedure Laterality Date   AUGMENTATION MAMMAPLASTY Bilateral 2000   COLONOSCOPY WITH PROPOFOL  N/A 02/05/2023   Procedure: COLONOSCOPY WITH PROPOFOL ;  Surgeon: Marnee Sink, MD;  Location: [facility];  Service: Endoscopy;  Laterality: N/A;   HIATAL HERNIA REPAIR     Family History  Problem Relation Age of Onset   Skin cancer Mother    Breast cancer Mother 78   Thyroid  disease Mother    Skin cancer Father    Pancreatic cancer Maternal Grandmother        unknown age   Social History   Socioeconomic History   Marital status: Married    Spouse name: Not on file   Number of children: Not on file   Years of education: Not on file   Highest education level: Not on file  Occupational History   Not on file  Tobacco Use   Smoking status: Never    Passive exposure: Never   Smokeless tobacco: Never  Vaping Use   Vaping status: Never Used  Substance and Sexual Activity   Alcohol use: Yes    Comment: Occ   Drug use: Never   Sexual activity: Not Currently    Birth control/protection: Post-menopausal  Other Topics Concern   Not on file  Social History Narrative   Right handed   Live with husband one story   Social Drivers of Health   Financial Resource Strain: Medium Risk (09/16/2020)   Received from [facility], [facility]   Overall Financial Resource Strain (CARDIA)    Difficulty of Paying Living Expenses: Somewhat hard  Food Insecurity: No Food Insecurity (03/13/2021)   Received from [facility], [facility]   Hunger Vital Sign    Worried About Running Out of Food in the Last Year: Never true    Ran Out of Food in the Last Year: Never true  Transportation Needs: Not on file  Physical Activity: Not on file  Stress: No Stress Concern Present (09/16/2020)   Received from [facility], [facility] - Occupational Stress Questionnaire    Feeling of Stress : Not at all  Social Connections: Unknown  (11/11/2021)   Received from [facility], [facility]   Social Network    Social Network: Not on file  Intimate Partner Violence: Unknown (10/03/2021)   Received from [facility], [facility]   HITS    Physically Hurt: Not on file    Insult or Talk Down To: Not on file    Threaten Physical Harm: Not on file    Scream or Curse: Not on file   Outpatient Medications Prior to Visit  Medication Sig Dispense Refill   cholecalciferol (VITAMIN D3) 25 MCG (1000 UNIT) tablet Take 1,000 Units by mouth daily.     cyclobenzaprine  (FLEXERIL ) 10  MG tablet Take 1 tablet (10 mg total) by mouth at bedtime as needed for muscle spasms. 30 tablet 5   Fezolinetant  (VEOZAH ) 45 MG TABS Take 1 tablet (45 mg total) by mouth daily. 30 tablet 2   Galcanezumab -gnlm (EMGALITY ) 120 MG/ML SOAJ ADMINISTER 1 ML UNDER THE SKIN EVERY 28 DAYSADMINISTER 1 ML UNDER THE SKIN EVERY 28 DAYS 1 mL 11   hydrocortisone  (PROCTOZONE -HC) 2.5 % rectal cream Place 1 Application rectally 2 (two) times daily. (Patient taking differently: Place 1 Application rectally 2 (two) times daily as needed.) 30 g 0   Ubrogepant  ([name] ) 100 MG TABS Take 1 tablet (100 mg total) by mouth as needed. TAKE 1 TABLET BY MOUTH AS NEEDED(MAY REPEAT AFTER 2 HOURS. MAX 2/24 HOURS) 10 tablet 5   valACYclovir  (VALTREX ) 500 MG tablet Take 1 tablet (500 mg total) by mouth daily. 90 tablet 1   busPIRone  (BUSPAR ) 5 MG tablet Take 1 tablet (5 mg total) by mouth 2 (two) times daily as needed. 30 tablet 1   cetirizine (ZYRTEC) 10 MG tablet Take 10 mg by mouth daily as needed.     escitalopram  (LEXAPRO ) 20 MG tablet Take 1 tablet (20 mg total) by mouth daily. 30 tablet 1   atropine 1 % ophthalmic solution 2 drops. (Patient not taking: Reported on 11/04/2023)     hydrocortisone  2.5 % cream  (Patient not taking: Reported on 11/04/2023)     No facility-administered medications prior to visit.   Allergies  Allergen Reactions   Latex Rash    Powdered gloves when worn    Penicillins Rash   Sulfa Antibiotics Hives   ROS: see HPI     Objective   Today's Vitals   11/04/23 1053  BP: 122/83  Pulse: 63  Resp: 18  Temp: 98 F (36.7 C)  TempSrc: Oral  SpO2: 98%  Weight: 144 lb (65.3 kg)  Height: 5\' 4"  (1.626 m)  PainSc: 8   PainLoc: Head   GENERAL: Well-appearing, in NAD. Well nourished.  SKIN: Pink, warm and dry. No rash, lesion, ulceration, or ecchymoses.  Head: Normocephalic. NECK: Trachea midline. Full ROM w/o pain or tenderness. No lymphadenopathy.  EARS: Tympanic membranes are intact, translucent without bulging and without drainage. Appropriate landmarks visualized.  EYES: Conjunctiva clear without exudates. EOMI, PERRL, no drainage present.  NOSE: Septum midline w/o deformity. Nares patent, mucosa pink and non-inflamed w/o drainage. No sinus tenderness.  THROAT: Uvula midline. Oropharynx clear. Tonsils non-inflamed without exudate. Mucous membranes pink and moist.  RESPIRATORY: Chest wall symmetrical. Respirations even and non-labored. Breath sounds clear to auscultation bilaterally.  CARDIAC: S1, S2 present, regular rate and rhythm without murmur or gallops. Peripheral pulses 2+ bilaterally.  MSK: Muscle tone and strength appropriate for age. Joints w/o tenderness, redness, or swelling.  EXTREMITIES: Without clubbing, cyanosis, or edema.  NEUROLOGIC: No motor or sensory deficits. Steady, even gait. C2-C12 intact.  PSYCH/MENTAL STATUS: Alert, oriented x 3. Cooperative, appropriate mood and affect.     Assessment & Plan:   1. Encounter to establish care (Primary) Patient is a 37- year-old female who presents today to establish care with primary care at [facility]. Reviewed the past medical history, family history, social history, surgical history, medications and allergies today- updates made as indicated. Patient has concerns today about possible acute sinus infection.   2. Anxiety and depression GAD7 completed with score of 6. Denies  issues with panic attacks, shortness of breath, difficulty breathing, palpitations, hyperventilation, and dizziness. PHQ9 completed with score of 12. Denies  active or passive suicidal ideations.  Patient is currently on Lexapro  20mg  daily and Buspar  5mg  BID PRN and reports her mood is well controlled. Rx sent to pharmacy on file.  - busPIRone  (BUSPAR ) 5 MG tablet; Take 1 tablet (5 mg total) by mouth 2 (two) times daily as needed.  Dispense: 30 tablet; Refill: 1 - escitalopram  (LEXAPRO ) 20 MG tablet; Take 1 tablet (20 mg total) by mouth daily.  Dispense: 30 tablet; Refill: 1  3. Vitamin D  deficiency Patient has history of vitamin D  deficiency. Will check vitamin D  today.  - VITAMIN D  25 Hydroxy (Vit-D Deficiency, Fractures)  4. Acute bacterial rhinosinusitis Patient presents today for acute sinus pain/pressure with ear pressure, muffled sounds, sore throat, anon-productive cough and nasal congestion. Denies fever/chills, chest pain, shortness of breath, wheezing. Physical exam with normal ear exam- no erythema, bulging or retraction present. Maxillary sinus tenderness to palpation. Denies frontal sinus tenderness. Nasal turbinates edematous with erythema present. Will treat for acute sinus infection. Provided patient with rx for prevention of yeast infection after she completes the antibiotic.  - doxycycline (VIBRA-TABS) 100 MG tablet; Take 1 tablet (100 mg total) by mouth 2 (two) times daily for 7 days.  Dispense: 14 tablet; Refill: 0  5. Chronic fatigue Patient reports she has been dealing with chronic fatigue. Will check thyroid  function, blood count, vitamin B12 level, vitamin D  level and CMP. Will update patient with results.  - TSH+T4F+T3Free - CBC with Differential/Platelet - Vitamin B12 - Comprehensive metabolic panel with GFR   Return in about 3 months (around 02/04/2024) for Mood f/u.   Wilhelmena Hanson, FNP

## 2023-11-05 ENCOUNTER — Encounter: Payer: Self-pay | Admitting: Family Medicine

## 2023-11-05 LAB — CBC WITH DIFFERENTIAL/PLATELET
Basophils Absolute: 0.1 10*3/uL (ref 0.0–0.2)
Basos: 1 %
EOS (ABSOLUTE): 0.2 10*3/uL (ref 0.0–0.4)
Eos: 4 %
Hematocrit: 41.2 % (ref 34.0–46.6)
Hemoglobin: 13.5 g/dL (ref 11.1–15.9)
Immature Grans (Abs): 0 10*3/uL (ref 0.0–0.1)
Immature Granulocytes: 0 %
Lymphocytes Absolute: 2 10*3/uL (ref 0.7–3.1)
Lymphs: 41 %
MCH: 31 pg (ref 26.6–33.0)
MCHC: 32.8 g/dL (ref 31.5–35.7)
MCV: 95 fL (ref 79–97)
Monocytes Absolute: 0.5 10*3/uL (ref 0.1–0.9)
Monocytes: 9 %
Neutrophils Absolute: 2.1 10*3/uL (ref 1.4–7.0)
Neutrophils: 45 %
Platelets: 241 10*3/uL (ref 150–450)
RBC: 4.36 x10E6/uL (ref 3.77–5.28)
RDW: 12.1 % (ref 11.7–15.4)
WBC: 4.8 10*3/uL (ref 3.4–10.8)

## 2023-11-05 LAB — COMPREHENSIVE METABOLIC PANEL WITH GFR
ALT: 15 IU/L (ref 0–32)
AST: 17 IU/L (ref 0–40)
Albumin: 4.7 g/dL (ref 3.8–4.9)
Alkaline Phosphatase: 63 IU/L (ref 44–121)
BUN/Creatinine Ratio: 18 (ref 9–23)
BUN: 11 mg/dL (ref 6–24)
Bilirubin Total: 0.4 mg/dL (ref 0.0–1.2)
CO2: 19 mmol/L — ABNORMAL LOW (ref 20–29)
Calcium: 9.5 mg/dL (ref 8.7–10.2)
Chloride: 102 mmol/L (ref 96–106)
Creatinine, Ser: 0.6 mg/dL (ref 0.57–1.00)
Globulin, Total: 2.2 g/dL (ref 1.5–4.5)
Glucose: 82 mg/dL (ref 70–99)
Potassium: 3.7 mmol/L (ref 3.5–5.2)
Sodium: 141 mmol/L (ref 134–144)
Total Protein: 6.9 g/dL (ref 6.0–8.5)
eGFR: 106 mL/min/{1.73_m2} (ref >59)

## 2023-11-05 LAB — VITAMIN B12: Vitamin B-12: 422 pg/mL (ref 232–1245)

## 2023-11-05 LAB — TSH+T4F+T3FREE
Free T4: 0.95 ng/dL (ref 0.82–1.77)
T3, Free: 2.8 pg/mL (ref 2.0–4.4)
TSH: 6 u[IU]/mL — ABNORMAL HIGH (ref 0.450–4.500)

## 2023-11-05 LAB — VITAMIN D 25 HYDROXY (VIT D DEFICIENCY, FRACTURES): Vit D, 25-Hydroxy: 29.3 ng/mL — ABNORMAL LOW (ref 30.0–100.0)

## 2024-02-02 ENCOUNTER — Encounter: Payer: Self-pay | Admitting: Family Medicine

## 2024-02-02 ENCOUNTER — Ambulatory Visit

## 2024-02-02 ENCOUNTER — Ambulatory Visit (INDEPENDENT_AMBULATORY_CARE_PROVIDER_SITE_OTHER): Admitting: Family Medicine

## 2024-02-02 ENCOUNTER — Telehealth: Payer: Self-pay

## 2024-02-02 ENCOUNTER — Ambulatory Visit: Admitting: Family Medicine

## 2024-02-02 VITALS — BP 111/79 | HR 57 | Temp 97.7°F | Resp 18 | Ht 64.0 in | Wt 145.0 lb

## 2024-02-02 DIAGNOSIS — R1031 Right lower quadrant pain: Secondary | ICD-10-CM | POA: Insufficient documentation

## 2024-02-02 NOTE — Assessment & Plan Note (Addendum)
 Has had intermittent acute right lower quadrant pain for a week.  No fever but nausea and more frequent bowel movements.  Will get a stat CT abdomen and pelvis to rule out appendicitis.  Checking LFTs and CBC.

## 2024-02-02 NOTE — Progress Notes (Signed)
 Established Patient Office Visit  Subjective   Patient ID: Renee Wheeler, female    DOB: 02-22-1966  Age: 58 y.o. MRN: [id number]  Chief Complaint  Patient presents with   Diarrhea   Nausea    At at [employer] on 01/29/24. Started that evening.    Diarrhea    Discussed the use of AI scribe software for clinical note transcription with the patient, who gave verbal consent to proceed.  History of Present Illness   57 year old with anxiety/depression, vitamin D  deficiency, chronic fatigue syndrome, migraine headaches, colonoscopy 02/16/2023 normal (ruled out Crohn's disease).  Renee Wheeler is a 58 year old female who presents with intermittent right-sided abdominal pain and diarrhea. She has a long history of IBS/constipation  She experiences a sharp, stabbing pain in her right lower abdomen that began after eating a well-cooked hamburger at [employer] on Saturday. The pain is intermittent and sometimes affects the whole abdomen. It is accompanied by nausea, which was severe enough to prevent her from driving, but resolved by Monday evening after taking Dramamine. No fever is present.  She has been experiencing diarrhea since the incident, occurring about one to two hours after eating. She denies any black or bright red blood in her stools. A colonoscopy in 01/2023 was normal. She was previously prescribed Linzess for suspected IBS but never filled the prescription. She has a history of both constipation and diarrhea and sometimes uses Metamucil, which she finds effective.  Her family history includes her mother having had her appendix and gallbladder removed, her sister having had her gallbladder removed, and her grandfather having died from pancreatic cancer. She has a history of migraines and vertigo, for which she takes Etygality, which she reports is effective in managing her migraines. She also has a history of iritis, which required treatment with an injection in the  eye.         Review of Systems  Gastrointestinal:  Positive for diarrhea.      Objective:     BP 111/79   Pulse (!) 57   Temp 97.7 F (36.5 C) (Oral)   Resp 18   Ht 5' 4 (1.626 m)   Wt 145 lb (65.8 kg)   LMP 11/24/2017 (Approximate)   SpO2 98%   BMI 24.89 kg/m    Physical Exam Vitals and nursing note reviewed.  Constitutional:      Appearance: Normal appearance.  HENT:     Head: Normocephalic and atraumatic.  Eyes:     Conjunctiva/sclera: Conjunctivae normal.  Cardiovascular:     Rate and Rhythm: Normal rate and regular rhythm.  Pulmonary:     Effort: Pulmonary effort is normal.     Breath sounds: Normal breath sounds.  Abdominal:     General: Abdomen is flat. Bowel sounds are normal.     Palpations: Abdomen is soft.     Tenderness: There is abdominal tenderness in the right lower quadrant. There is no rebound. Negative signs include McBurney's sign, psoas sign and obturator sign.  Musculoskeletal:     Right lower leg: No edema.     Left lower leg: No edema.  Skin:    General: Skin is warm and dry.  Neurological:     Mental Status: She is alert and oriented to person, place, and time.  Psychiatric:        Mood and Affect: Mood normal.        Behavior: Behavior normal.        Thought Content: Thought  content normal.        Judgment: Judgment normal.          No results found for any visits on 02/02/24.    The ASCVD Risk score (Arnett DK, et al., 2019) failed to calculate for the following reasons:   Cannot find a previous HDL lab   Cannot find a previous total cholesterol lab    Assessment & Plan:  Right lower quadrant abdominal pain -     CT ABDOMEN PELVIS W CONTRAST; Future -     CBC with Differential/Platelet -     Comprehensive metabolic panel with GFR  Acute right lower quadrant pain Assessment & Plan: Has had intermittent acute right lower quadrant pain for a week.  No fever but nausea and more frequent bowel movements.  Will get a  stat CT abdomen and pelvis to rule out appendicitis.  Checking LFTs and CBC.    Assessment and Plan    Right lower quadrant abdominal pain, possible appendicitis Intermittent right lower quadrant abdominal pain with associated nausea and diarrhea following ingestion of a hamburger. Pain is not constant and does not worsen with movement, making appendicitis less likely, but a smoldering appendicitis cannot be ruled out. No fever reported. Differential includes appendicitis, dumping syndrome, and irritable bowel syndrome. - Order CT scan of the abdomen at [facility] to rule out appendicitis. - Advise to go to the ER if pain becomes constant, unbearable, or if she feels sick and the CT scan cannot be performed promptly.  Irritable bowel syndrome Exacerbation characterized by diarrhea following meals. Colonoscopy in August 2024  was normal, ruling out Crohn's disease. Symptoms may be related to dietary triggers, such as high-fat foods. - Discuss the use of Metamucil as needed for bowel regulation.   Return FU after CT scan..    Filipe Greathouse K Kaidin Boehle, MD

## 2024-02-02 NOTE — Telephone Encounter (Signed)
 Left VM for patient to call back and schedule appt from today to another day. Renee Wheeler can not come in today.  - CM

## 2024-02-03 ENCOUNTER — Other Ambulatory Visit: Payer: Self-pay | Admitting: Family Medicine

## 2024-02-03 ENCOUNTER — Ambulatory Visit: Payer: Self-pay | Admitting: Family Medicine

## 2024-02-03 ENCOUNTER — Telehealth: Payer: Self-pay | Admitting: Family Medicine

## 2024-02-03 DIAGNOSIS — F32A Depression, unspecified: Secondary | ICD-10-CM

## 2024-02-03 LAB — CBC WITH DIFFERENTIAL/PLATELET
Basophils Absolute: 0.1 x10E3/uL (ref 0.0–0.2)
Basos: 1 %
EOS (ABSOLUTE): 0.2 x10E3/uL (ref 0.0–0.4)
Eos: 6 %
Hematocrit: 40.7 % (ref 34.0–46.6)
Hemoglobin: 13.2 g/dL (ref 11.1–15.9)
Immature Grans (Abs): 0 x10E3/uL (ref 0.0–0.1)
Immature Granulocytes: 0 %
Lymphocytes Absolute: 1.6 x10E3/uL (ref 0.7–3.1)
Lymphs: 41 %
MCH: 31.2 pg (ref 26.6–33.0)
MCHC: 32.4 g/dL (ref 31.5–35.7)
MCV: 96 fL (ref 79–97)
Monocytes Absolute: 0.4 x10E3/uL (ref 0.1–0.9)
Monocytes: 11 %
Neutrophils Absolute: 1.7 x10E3/uL (ref 1.4–7.0)
Neutrophils: 41 %
Platelets: 224 x10E3/uL (ref 150–450)
RBC: 4.23 x10E6/uL (ref 3.77–5.28)
RDW: 12.4 % (ref 11.7–15.4)
WBC: 4 x10E3/uL (ref 3.4–10.8)

## 2024-02-03 LAB — COMPREHENSIVE METABOLIC PANEL WITH GFR
ALT: 10 IU/L (ref 0–32)
AST: 16 IU/L (ref 0–40)
Albumin: 4.2 g/dL (ref 3.8–4.9)
Alkaline Phosphatase: 58 IU/L (ref 44–121)
BUN/Creatinine Ratio: 30 — ABNORMAL HIGH (ref 9–23)
BUN: 16 mg/dL (ref 6–24)
Bilirubin Total: 0.4 mg/dL (ref 0.0–1.2)
CO2: 22 mmol/L (ref 20–29)
Calcium: 9.6 mg/dL (ref 8.7–10.2)
Chloride: 106 mmol/L (ref 96–106)
Creatinine, Ser: 0.54 mg/dL — ABNORMAL LOW (ref 0.57–1.00)
Globulin, Total: 2.4 g/dL (ref 1.5–4.5)
Glucose: 83 mg/dL (ref 70–99)
Potassium: 4.4 mmol/L (ref 3.5–5.2)
Sodium: 141 mmol/L (ref 134–144)
Total Protein: 6.6 g/dL (ref 6.0–8.5)
eGFR: 109 mL/min/1.73 (ref >59)

## 2024-02-03 NOTE — Telephone Encounter (Signed)
 Copied from CRM [id number]. Topic: General - Other >> Feb 03, 2024  1:11 PM Deleta S wrote: Reason for CRM: patient is wondering ct scan is still needed please give the patient a call regarding concerns and rescheduling

## 2024-02-03 NOTE — Telephone Encounter (Unsigned)
 Copied from CRM [id number]. Topic: Clinical - Medication Refill >> Feb 03, 2024  1:13 PM Aaliyah S wrote: Medication: Lexapro  20 MG tablet, Zyrtec  10 MG tablet  Has the patient contacted their pharmacy? no (Agent: If no, request that the patient contact the pharmacy for the refill. If patient does not wish to contact the pharmacy document the reason why and proceed with request.) (Agent: If yes, when and what did the pharmacy advise?)  This is the patient's preferred pharmacy:  [id number] [place], [place] - [address] AT [facility] OF [address] [place] [address] Phone: [phone] Fax: [phone]    Is this the correct pharmacy for this prescription? yes If no, delete pharmacy and type the correct one.   Has the prescription been filled recently? Yes  Is the patient out of the medication? Yes  Has the patient been seen for an appointment in the last year OR does the patient have an upcoming appointment? Yes  Can we respond through MyChart? Yes  Agent: Please be advised that Rx refills may take up to 3 business days. We ask that you follow-up with your pharmacy.

## 2024-02-04 ENCOUNTER — Other Ambulatory Visit: Payer: Self-pay | Admitting: Family Medicine

## 2024-02-04 ENCOUNTER — Telehealth: Payer: Self-pay

## 2024-02-04 DIAGNOSIS — R110 Nausea: Secondary | ICD-10-CM

## 2024-02-04 MED ORDER — ESCITALOPRAM OXALATE 20 MG PO TABS
20.0000 mg | ORAL_TABLET | Freq: Every day | ORAL | 1 refills | Status: AC
Start: 2024-02-04 — End: ?

## 2024-02-04 MED ORDER — CETIRIZINE HCL 10 MG PO TABS: 10.0000 mg | ORAL_TABLET | Freq: Every day | ORAL | 1 refills | Status: DC | PRN

## 2024-02-04 MED ORDER — ONDANSETRON HCL 4 MG PO TABS: 4.0000 mg | ORAL_TABLET | Freq: Three times a day (TID) | ORAL | 0 refills | Status: AC | PRN

## 2024-02-04 NOTE — Telephone Encounter (Signed)
 Patient states that she still needs a medication for nausea and IBS.

## 2024-02-04 NOTE — Telephone Encounter (Signed)
 Copied from CRM [id number]. Topic: General - Other >> Feb 03, 2024  1:11 PM Deleta S wrote: Reason for CRM: patient is wondering ct scan is still needed please give the patient a call regarding concerns and rescheduling >> Feb 04, 2024  1:30 PM Mia F wrote: Pt says she got the message about the CT and med refills but she is asking about the IBS. She says she think a rx was supposed to be sent for the IBS but she isn't sure. Please advise   Please contact via My Chart  >> Feb 04, 2024  7:59 AM Mia F wrote: Pt called back about yesterday message. Upset that she didn't get a call back. Pt was advise that since she called at the end of the day, she should get a call back today. Pt say something and hung up. Please advise  >> Feb 03, 2024  1:19 PM Deleta S wrote: She would like to know if vomiting medication will be available if needed

## 2024-02-04 NOTE — Telephone Encounter (Signed)
 Copied from CRM [id number]. Topic: General - Other >> Feb 03, 2024  1:11 PM Deleta RAMAN wrote: Reason for CRM: patient is wondering ct scan is still needed please give the patient a call regarding concerns and rescheduling >> Feb 04, 2024  7:59 AM Mia F wrote: Pt called back about yesterday message. Upset that she didn't get a call back. Pt was advise that since she called at the end of the day, she should get a call back today. Pt say something and hung up. Please advise  >> Feb 03, 2024  1:19 PM Deleta S wrote: She would like to know if vomiting medication will be available if needed

## 2024-03-30 ENCOUNTER — Ambulatory Visit: Admitting: Family Medicine

## 2024-03-30 ENCOUNTER — Ambulatory Visit: Payer: Self-pay

## 2024-03-30 NOTE — Telephone Encounter (Signed)
 FYI Only or Action Required?: FYI only for provider.  Patient was last seen in primary care on 02/02/2024 by Ziglar, Susan K, MD.  Called Nurse Triage reporting Abdominal Pain.  Symptoms began yesterday.  Interventions attempted: Other: miralax.  Symptoms are: rapidly worsening.  Triage Disposition: See Physician Within 24 Hours  Patient/caregiver understands and will follow disposition?: Yes     Copied from CRM #[id number]. Topic: Clinical - Red Word Triage >> Mar 30, 2024 10:33 AM Larissa RAMAN wrote: [facility] that prompted transfer to Nurse Triage: abdominal cramping and pain Reason for Disposition  [1] MODERATE pain (e.g., interferes with normal activities) AND [2] pain comes and goes (cramps) AND [3] present > 24 hours  (Exception: Pain with Vomiting or Diarrhea - see that Guideline.)  Answer Assessment - Initial Assessment Questions 1. LOCATION: Where does it hurt?      Abd - entire abd 2. RADIATION: Does the pain shoot anywhere else? (e.g., chest, back)     no 3. ONSET: When did the pain begin? (e.g., minutes, hours or days ago)      Yesterday 4. SUDDEN: Gradual or sudden onset?     Gradual  5. PATTERN Does the pain come and go, or is it constant?     constant 6. SEVERITY: How bad is the pain?  (e.g., Scale 1-10; mild, moderate, or severe)     8/10 7. RECURRENT SYMPTOM: Have you ever had this type of stomach pain before? If Yes, ask: When was the last time? and What happened that time?      At times due to IBS 8. CAUSE: What do you think is causing the stomach pain? (e.g., gallstones, recent abdominal surgery)     constipation 9. RELIEVING/AGGRAVATING FACTORS: What makes it better or worse? (e.g., antacids, bending or twisting motion, bowel movement)     nba 10. OTHER SYMPTOMS: Do you have any other symptoms? (e.g., back pain, diarrhea, fever, urination pain, vomiting)       Abd pain, abd bloating 11. PREGNANCY: Is there any chance you are  pregnant? When was your last menstrual period?       na  Last BM x week: abd pain started last night.pt has IBS & belives she is constipated.  Pt stated she is not able to eat or drink anything due to bloating and discomfort: scheduled pt appt for today.  Protocols used: Abdominal Pain - Female-A-AH

## 2024-03-30 NOTE — Telephone Encounter (Signed)
 FYI Only or Action Required?: Action required by provider: update on patient condition.  Patient was last seen in primary care on 02/02/2024 by Ziglar, Susan K, MD.  Called Nurse Triage reporting Appointment.  Symptoms began a week ago.  Interventions attempted: OTC medications: Miralax .  Symptoms are: unchanged.  Triage Disposition: See Physician Within 24 Hours  Patient/caregiver understands and will follow disposition?: No  Copied from CRM [id number]. Topic: General - Other >> Mar 30, 2024  1:12 PM Cleave MATSU wrote: Reason for CRM: please reach back out to pt regarding appt today she can't come In at that time but was told she needs to come in today because of the pain she's having Reason for Disposition  Last bowel movement (BM) > 4 days ago  Answer Assessment - Initial Assessment Questions Additional info: Patient was triaged earlier today and scheduled an acute visit at 310, she is unable to leave work early and asking to reschedule appointment. Offered next acute visit 03/31/24 but she declines this due to work. Advised she can seek evaluation at urgent care but she states she will not go to urgent care due to copay. She is asking recommendation for stronger laxative to Miralax that she can try before rescheduling acute visit. Advised as per care advice. Patient will call back if no BM after otc treatment or if new symptoms develop.     1. STOOL PATTERN OR FREQUENCY: How often do you have a bowel movement (BM)?  (Normal range: 3 times a day to every 3 days)  When was your last BM?       One week ago 2. STRAINING: Do you have to strain to have a BM?       3. ONSET: When did the constipation begin?     One week ago 4. RECTAL PAIN: Does your rectum hurt when the stool comes out? If Yes, ask: Do you have hemorrhoids? How bad is the pain?  (Scale 1-10; or mild, moderate, severe)      5. BM COMPOSITION: Are the stools hard?      No bm 6. BLOOD ON STOOLS: Has there  been any blood on the toilet tissue or on the surface of the BM? If Yes, ask: When was the last time?     No rectal bleeding noted 7. CHRONIC CONSTIPATION: Is this a new problem for you?  If No, ask: How long have you had this problem? (days, weeks, months)      ibs 8. CHANGES IN DIET OR HYDRATION: Have there been any recent changes in your diet? How much fluids are you drinking on a daily basis?  How much have you had to drink today?      9. MEDICINES: Have you been taking any new medicines? Are you taking any narcotic pain medicines? (e.g., Dilaudid, morphine, Percocet, Vicodin)      10. LAXATIVES: Have you been using any stool softeners, laxatives, or enemas?  If Yes, ask What are you using, how often, and when was the last time?       miralax 11. ACTIVITY:  How much walking do you do every day?  Has your activity level decreased in the past week?        Active at work 12. CAUSE: What do you think is causing the constipation?        ibs 13. MEDICAL HISTORY: Do you have a history of hemorrhoids, rectal fissures, rectal surgery, or rectal abscess?  ibs 14. OTHER SYMPTOMS: Do you have any other symptoms? (e.g., abdomen pain, bloating, fever, vomiting)       Abdominal fullness  Protocols used: Constipation-A-AH

## 2024-07-12 ENCOUNTER — Other Ambulatory Visit: Payer: Self-pay | Admitting: Family Medicine

## 2024-07-12 ENCOUNTER — Telehealth: Payer: Self-pay | Admitting: Pharmacy Technician

## 2024-07-12 ENCOUNTER — Telehealth: Payer: Self-pay | Admitting: Neurology

## 2024-07-12 ENCOUNTER — Other Ambulatory Visit (HOSPITAL_COMMUNITY): Payer: Self-pay

## 2024-07-12 MED ORDER — CETIRIZINE HCL 10 MG PO TABS: 10.0000 mg | ORAL_TABLET | Freq: Every day | ORAL | 0 refills | Status: AC | PRN

## 2024-07-12 NOTE — Telephone Encounter (Signed)
 Pharmacy Patient Advocate Encounter  Received notification from [facility] that Prior Authorization for EMGALITY  120MG  has been APPROVED from 1.14.26 to 1.14.27. Ran test claim, Copay is $35. This test claim was processed through [facility]- copay amounts may vary at other pharmacies due to pharmacy/plan contracts, or as the patient moves through the different stages of their insurance plan.   PA #/Case ID/Reference #: [id number]

## 2024-07-12 NOTE — Telephone Encounter (Signed)
 Patient advised Insurance plan and polies can change anytime.    We will reach out to the PA team and advised a PA is need for the Emgality . Once approved we will let her know.    PA team please start a PA for Emgality . Patient is due Friday.

## 2024-07-12 NOTE — Telephone Encounter (Signed)
 PA has been submitted, and telephone encounter has been created. Please see telephone encounter dated 1.14.26.

## 2024-07-12 NOTE — Telephone Encounter (Signed)
 Medication refilled

## 2024-07-12 NOTE — Telephone Encounter (Signed)
 Copied from CRM [id number]. Topic: Clinical - Medication Question >> Jul 12, 2024 10:43 AM Tonda B wrote: Reason for CRM: patient is calling wants to know if she can get a 90 day supply  cetirizine  (ZYRTEC ) 10 MG tablet please call pt back [phone]

## 2024-07-12 NOTE — Telephone Encounter (Signed)
 Naviah called in stating that she is at the pharmacy and was told that she is needing a P.A for Emgality . She stated that she has been getting it for years and never needed a P.A.  She stated that she is currently out and need it right now.   PH: [phone]

## 2024-07-12 NOTE — Telephone Encounter (Signed)
 Pharmacy Patient Advocate Encounter   Received notification from Pt Calls Messages that prior authorization for EMGALITY  120MG  is required/requested.   Insurance verification completed.   The patient is insured through [facility].   Per test claim: PA required; PA submitted to above mentioned insurance via Latent Key/confirmation #/EOC [id number] Status is pending

## 2024-07-31 ENCOUNTER — Ambulatory Visit: Admitting: Family Medicine

## 2024-08-01 ENCOUNTER — Ambulatory Visit: Admitting: Family Medicine

## 2024-08-01 ENCOUNTER — Encounter: Payer: Self-pay | Admitting: Family Medicine

## 2024-08-01 VITALS — BP 112/71 | HR 74 | Ht 64.0 in | Wt 148.0 lb

## 2024-08-01 DIAGNOSIS — L2082 Flexural eczema: Secondary | ICD-10-CM

## 2024-08-01 DIAGNOSIS — H1031 Unspecified acute conjunctivitis, right eye: Secondary | ICD-10-CM

## 2024-08-01 MED ORDER — AZITHROMYCIN 1 % OP SOLN: 1.0000 [drp] | Freq: Two times a day (BID) | OPHTHALMIC | 0 refills | Status: AC

## 2024-08-01 MED ORDER — TACROLIMUS 0.1 % EX OINT: TOPICAL_OINTMENT | Freq: Two times a day (BID) | CUTANEOUS | 0 refills | Status: AC

## 2024-08-01 NOTE — Progress Notes (Unsigned)
" ° °  Established Patient Office Visit  Subjective   Patient ID: Renee Wheeler, female    DOB: 22-Oct-1965  Age: 59 y.o. MRN: [id number]  Chief Complaint  Patient presents with   Conjunctivitis    Started a couple days ago. A little painful. Bilateral itchiness    Conjunctivitis    Discussed the use of AI scribe software for clinical note transcription with the patient, who gave verbal consent to proceed.  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  She began experiencing itching in her eyes a few days ago and used allergy drops for relief. Subsequently, her right eye developed yellowish sticky discharge, described as 'eye boogers.' She is concerned about the possibility of the condition affecting her left eye as well. She has been using generic Zyrtec  for her allergies.  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 She is awaiting surgery to correct her vision but is concerned about the use of steroids post-surgery potentially exacerbating her condition.  She also reports issues with eczema, which appears better today but remains problematic. She is unable to see her dermatologist for two more months. She has previously used cortisone ointment without success and is seeking alternative treatments.  Vision is reported as okay in both eyes.       Objective:     BP 112/71   Pulse 74   Ht 5' 4 (1.626 m)   Wt 148 lb (67.1 kg)   LMP 11/24/2017   SpO2 96%   BMI 25.40 kg/m  {Vitals History (Optional):23777}  Physical Exam   {Perform Simple Foot Exam  Perform Detailed exam:1} {Insert foot Exam (Optional):30965}   No results found for any visits on 08/01/24.  {Labs (Optional):23779}  The ASCVD Risk score (Arnett DK, et al., 2019) failed to calculate for the following reasons:   Cannot find a  previous HDL lab   Cannot find a previous total cholesterol lab   * - Cholesterol units were assumed    Assessment & Plan:  Acute bacterial conjunctivitis of right eye -     Azithromycin ; Place 1 drop into the right eye 2 (two) times daily.  Dispense: 2.5 mL; Refill: 0  Other orders -     Tacrolimus ; Apply topically 2 (two) times daily. Apply topically to affected areas twice a day  Dispense: 100 g; Refill: 0     Return if symptoms worsen or fail to improve.    Bernis Schreur K Yoselyn Mcglade, MD "

## 2024-08-02 ENCOUNTER — Telehealth: Payer: Self-pay

## 2024-08-02 ENCOUNTER — Other Ambulatory Visit (HOSPITAL_COMMUNITY): Payer: Self-pay

## 2024-08-02 ENCOUNTER — Telehealth: Payer: Self-pay | Admitting: Family Medicine

## 2024-08-02 DIAGNOSIS — L309 Dermatitis, unspecified: Secondary | ICD-10-CM | POA: Insufficient documentation

## 2024-08-02 NOTE — Telephone Encounter (Signed)
 Copied from CRM [id number]. Topic: Clinical - Prescription Issue >> Aug 02, 2024  1:29 PM Berneda FALCON wrote: Reason for CRM: Patient called in extremely upset and frustrated about her medication not being called in for her pink eye. States she was seen at the office for this yesterday (2/3) and has had to call multiple times as the medication was not sent in for her. Upon looking, it appeared that the medication WAS indeed sent for her yesterday, but patient would not allow me to speak long enough to let her know this and demanded an print production planner. I called [facility] to see if there was an print production planner available and was informed the office manager was not in today. At this point, I reached out to my support chat to see if a supervisor was available to assist so that we can help this patient to the best of our ability. Call was warm transferred to [facility] [place] for further assistance.

## 2024-08-02 NOTE — Telephone Encounter (Signed)
 Copied from CRM [id number]. Topic: General - Other >> Aug 02, 2024  1:42 PM Rosaria E wrote: Reason for CRM: Pt called for update on medication following her appt yesterday. The azithromycin  (AZASITE ) 1 % ophthalmic solution is waiting on prior auth and the patient is very upset that she still does not have treatment/relief from her symptoms. Wants to speak to the clinic. Tried calling [facility].   232 pm 08/02/24 Left the patient a voicemail to let her know, This medication was sent to the PA Team once notified the medicine needed it. The pharmacy will not have this medicine in until Friday. Dr. Ziglar has sent in  Gentamicin and will be able to pick up today. The patient can call back [phone] or sent mychart questions with any questions.

## 2024-08-02 NOTE — Telephone Encounter (Signed)
 Pharmacy Patient Advocate Encounter   Received notification from [facility] that prior authorization for Azasite  1% is required/requested.   Insurance verification completed.   The patient is insured through [employer].   Per test claim: Medication is not covered through Medicare Part D and must be billed through Medicare Part B.  Thank you   Per test claim is a plan exclusion.

## 2024-08-02 NOTE — Telephone Encounter (Signed)
 We were able to get in contact with the pharmacy. Both the eye drops and cream are covered by patients insurance. They instructed us  to have the patient call the pharmacy because the pharmacy call center was unsure if the eye drops had to be ordered. Patient is going to contact pharmacy and let us  know with any further issues

## 2024-08-02 NOTE — Assessment & Plan Note (Signed)
 Having a flare of her eczema on her wrist and dorsum of hands.  Tacrolimus  0.1% twice daily

## 2024-11-10 ENCOUNTER — Ambulatory Visit: Admitting: Neurology
